# Patient Record
Sex: Female | Born: 1960 | Race: White | Hispanic: No | Marital: Single | Smoking: Current every day smoker
Health system: Southern US, Community
[De-identification: ages and names within clinical notes are randomized; demographics above are authoritative.]

## PROBLEM LIST (undated history)

## (undated) DIAGNOSIS — G629 Polyneuropathy, unspecified: Secondary | ICD-10-CM

## (undated) DIAGNOSIS — E785 Hyperlipidemia, unspecified: Secondary | ICD-10-CM

## (undated) DIAGNOSIS — I7300 Raynaud's syndrome without gangrene: Secondary | ICD-10-CM

## (undated) DIAGNOSIS — E079 Disorder of thyroid, unspecified: Secondary | ICD-10-CM

## (undated) DIAGNOSIS — M542 Cervicalgia: Secondary | ICD-10-CM

## (undated) DIAGNOSIS — M797 Fibromyalgia: Secondary | ICD-10-CM

## (undated) DIAGNOSIS — F329 Major depressive disorder, single episode, unspecified: Secondary | ICD-10-CM

## (undated) DIAGNOSIS — F909 Attention-deficit hyperactivity disorder, unspecified type: Secondary | ICD-10-CM

## (undated) DIAGNOSIS — F32A Depression, unspecified: Secondary | ICD-10-CM

## (undated) DIAGNOSIS — G8929 Other chronic pain: Secondary | ICD-10-CM

## (undated) HISTORY — DX: Attention-deficit hyperactivity disorder, unspecified type: F90.9

## (undated) HISTORY — DX: Cervicalgia: M54.2

## (undated) HISTORY — DX: Raynaud's syndrome without gangrene: I73.00

## (undated) HISTORY — DX: Depression, unspecified: F32.A

## (undated) HISTORY — PX: LEG SURGERY: SHX1003

## (undated) HISTORY — DX: Other chronic pain: G89.29

## (undated) HISTORY — PX: OTHER SURGICAL HISTORY: SHX169

## (undated) HISTORY — DX: Major depressive disorder, single episode, unspecified: F32.9

## (undated) HISTORY — PX: TUBAL LIGATION: SHX77

## (undated) HISTORY — DX: Hyperlipidemia, unspecified: E78.5

---

## 1987-05-06 DIAGNOSIS — E039 Hypothyroidism, unspecified: Secondary | ICD-10-CM

## 1987-05-06 HISTORY — DX: Hypothyroidism, unspecified: E03.9

## 2005-12-23 ENCOUNTER — Ambulatory Visit: Payer: Self-pay | Admitting: Gastroenterology

## 2006-02-06 ENCOUNTER — Ambulatory Visit: Payer: Self-pay | Admitting: Gastroenterology

## 2006-05-05 DIAGNOSIS — K759 Inflammatory liver disease, unspecified: Secondary | ICD-10-CM

## 2006-05-05 HISTORY — DX: Inflammatory liver disease, unspecified: K75.9

## 2006-05-07 ENCOUNTER — Ambulatory Visit: Payer: Self-pay | Admitting: Gastroenterology

## 2006-05-14 ENCOUNTER — Ambulatory Visit: Payer: Self-pay | Admitting: Gastroenterology

## 2006-05-28 ENCOUNTER — Ambulatory Visit: Payer: Self-pay | Admitting: Gastroenterology

## 2006-06-05 ENCOUNTER — Ambulatory Visit (HOSPITAL_COMMUNITY): Admission: RE | Admit: 2006-06-05 | Discharge: 2006-06-05 | Payer: Self-pay | Admitting: Gastroenterology

## 2006-06-11 ENCOUNTER — Ambulatory Visit: Payer: Self-pay | Admitting: Gastroenterology

## 2006-06-25 ENCOUNTER — Ambulatory Visit: Payer: Self-pay | Admitting: Gastroenterology

## 2006-07-23 ENCOUNTER — Encounter: Admission: RE | Admit: 2006-07-23 | Discharge: 2006-07-23 | Payer: Self-pay | Admitting: Obstetrics and Gynecology

## 2006-07-23 ENCOUNTER — Ambulatory Visit: Payer: Self-pay | Admitting: Gastroenterology

## 2006-08-20 ENCOUNTER — Ambulatory Visit: Payer: Self-pay | Admitting: Gastroenterology

## 2006-09-17 ENCOUNTER — Ambulatory Visit: Payer: Self-pay | Admitting: Gastroenterology

## 2006-09-17 ENCOUNTER — Ambulatory Visit (HOSPITAL_COMMUNITY): Admission: RE | Admit: 2006-09-17 | Discharge: 2006-09-17 | Payer: Self-pay | Admitting: Gastroenterology

## 2006-10-15 ENCOUNTER — Ambulatory Visit: Payer: Self-pay | Admitting: Gastroenterology

## 2006-11-19 ENCOUNTER — Ambulatory Visit: Payer: Self-pay | Admitting: Gastroenterology

## 2007-01-21 ENCOUNTER — Ambulatory Visit: Payer: Self-pay | Admitting: Gastroenterology

## 2007-04-22 ENCOUNTER — Ambulatory Visit: Payer: Self-pay | Admitting: Gastroenterology

## 2010-05-05 DIAGNOSIS — F419 Anxiety disorder, unspecified: Secondary | ICD-10-CM

## 2010-05-05 DIAGNOSIS — J189 Pneumonia, unspecified organism: Secondary | ICD-10-CM

## 2010-05-05 HISTORY — DX: Anxiety disorder, unspecified: F41.9

## 2010-05-05 HISTORY — DX: Pneumonia, unspecified organism: J18.9

## 2012-11-14 ENCOUNTER — Emergency Department (HOSPITAL_BASED_OUTPATIENT_CLINIC_OR_DEPARTMENT_OTHER): Payer: BC Managed Care – PPO

## 2012-11-14 ENCOUNTER — Encounter (HOSPITAL_BASED_OUTPATIENT_CLINIC_OR_DEPARTMENT_OTHER): Payer: Self-pay | Admitting: *Deleted

## 2012-11-14 ENCOUNTER — Emergency Department (HOSPITAL_BASED_OUTPATIENT_CLINIC_OR_DEPARTMENT_OTHER)
Admission: EM | Admit: 2012-11-14 | Discharge: 2012-11-14 | Disposition: A | Discharge status: Home / Self Care | Payer: BC Managed Care – PPO | Attending: Emergency Medicine | Admitting: Emergency Medicine

## 2012-11-14 DIAGNOSIS — Z79899 Other long term (current) drug therapy: Secondary | ICD-10-CM | POA: Insufficient documentation

## 2012-11-14 DIAGNOSIS — L539 Erythematous condition, unspecified: Secondary | ICD-10-CM | POA: Insufficient documentation

## 2012-11-14 DIAGNOSIS — E079 Disorder of thyroid, unspecified: Secondary | ICD-10-CM | POA: Insufficient documentation

## 2012-11-14 DIAGNOSIS — F17200 Nicotine dependence, unspecified, uncomplicated: Secondary | ICD-10-CM | POA: Insufficient documentation

## 2012-11-14 DIAGNOSIS — G589 Mononeuropathy, unspecified: Secondary | ICD-10-CM | POA: Insufficient documentation

## 2012-11-14 DIAGNOSIS — M7989 Other specified soft tissue disorders: Secondary | ICD-10-CM | POA: Insufficient documentation

## 2012-11-14 DIAGNOSIS — IMO0001 Reserved for inherently not codable concepts without codable children: Secondary | ICD-10-CM | POA: Insufficient documentation

## 2012-11-14 HISTORY — DX: Disorder of thyroid, unspecified: E07.9

## 2012-11-14 HISTORY — DX: Fibromyalgia: M79.7

## 2012-11-14 HISTORY — DX: Polyneuropathy, unspecified: G62.9

## 2012-11-14 LAB — CBC WITH DIFFERENTIAL/PLATELET
Eosinophils Absolute: 0.1 10*3/uL (ref 0.0–0.7)
Eosinophils Relative: 2 % (ref 0–5)
Lymphocytes Relative: 26 % (ref 12–46)
MCH: 32.5 pg (ref 26.0–34.0)
MCHC: 34.2 g/dL (ref 30.0–36.0)
MCV: 95 fL (ref 78.0–100.0)
Monocytes Absolute: 0.7 10*3/uL (ref 0.1–1.0)
Monocytes Relative: 11 % (ref 3–12)
Neutro Abs: 4.2 10*3/uL (ref 1.7–7.7)
RBC: 4.24 MIL/uL (ref 3.87–5.11)

## 2012-11-14 LAB — BASIC METABOLIC PANEL
BUN: 14 mg/dL (ref 6–23)
Calcium: 9.6 mg/dL (ref 8.4–10.5)
GFR calc Af Amer: 65 mL/min — ABNORMAL LOW (ref >90)
Glucose, Bld: 120 mg/dL — ABNORMAL HIGH (ref 70–99)

## 2012-11-14 MED ORDER — IBUPROFEN 800 MG PO TABS: 800.0000 mg | ORAL_TABLET | Freq: Three times a day (TID) | ORAL | Status: DC

## 2012-11-14 MED ORDER — SULFAMETHOXAZOLE-TRIMETHOPRIM 800-160 MG PO TABS: 1.0000 | ORAL_TABLET | Freq: Two times a day (BID) | ORAL | Status: AC

## 2012-11-14 NOTE — ED Notes (Signed)
MD at bedside. 

## 2012-11-14 NOTE — ED Notes (Signed)
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

## 2012-11-14 NOTE — ED Provider Notes (Signed)
History    CSN: [id number] Arrival date & time 11/14/12  1338  First MD Initiated Contact with Patient 11/14/12 1414     Chief Complaint  Patient presents with  . red warm painful area on left calf    (Consider location/radiation/quality/duration/timing/severity/associated sxs/prior Treatment) Patient is a 52 y.o. female presenting with leg pain. The history is provided by the patient. No language interpreter was used.  Leg Pain Location:  Leg Injury: no   Leg location:  L leg Pain details:    Quality:  Aching   Radiates to:  Does not radiate   Severity:  Moderate   Onset quality:  Gradual   Duration:  3 days   Timing:  Constant   Progression:  Worsening Chronicity:  New Foreign body present:  No foreign bodies Worsened by:  Nothing tried Ineffective treatments:  None tried Pt reports she has redness and pain to her left leg and calf muscle area.   Pt worried about a blood clot Past Medical History  Diagnosis Date  . Neuropathy   . Fibromyalgia   . Thyroid disease    Past Surgical History  Procedure Laterality Date  . Cesarean section     History reviewed. No pertinent family history. History  Substance Use Topics  . Smoking status: Current Every Day Smoker  . Smokeless tobacco: Not on file  . Alcohol Use: Yes     Comment: occ   OB History   Grav Para Term Preterm Abortions TAB SAB Ect Mult Living                 Review of Systems  Musculoskeletal: Positive for myalgias and joint swelling.  All other systems reviewed and are negative.    Allergies  Review of patient's allergies indicates no known allergies.  Home Medications   Current Outpatient Rx  Name  Route  Sig  Dispense  Refill  . amphetamine-dextroamphetamine (ADDERALL) 10 MG tablet   Oral   Take 10 mg by mouth daily.         . DULoxetine (CYMBALTA) 20 MG capsule   Oral   Take 20 mg by mouth daily.         Marland Kitchen levothyroxine (SYNTHROID, LEVOTHROID) 100 MCG tablet   Oral   Take 100  mcg by mouth daily before breakfast.         . methadone (DOLOPHINE) 10 MG tablet   Oral   Take 10 mg by mouth every 8 (eight) hours.          BP 142/77  Pulse 80  Temp(Src) 98.9 F (37.2 C) (Oral)  Resp 16  Ht 5\' 7"  (1.702 m)  Wt 195 lb (88.451 kg)  BMI 30.53 kg/m2  SpO2 99% Physical Exam  Nursing note and vitals reviewed. Constitutional: She appears well-developed and well-nourished.  HENT:  Head: Normocephalic.  Cardiovascular: Normal rate.   Pulmonary/Chest: Effort normal.  Abdominal: Soft.  Musculoskeletal: She exhibits tenderness.  Tender left lower leg,  Slight redness,    Neurological: She is alert.  Skin: Skin is warm.  Psychiatric: She has a normal mood and affect.    ED Course  Procedures (including critical care time) Labs Reviewed  BASIC METABOLIC PANEL - Abnormal; Notable for the following:    Glucose, Bld 120 (*)    GFR calc non Af Amer 56 (*)    GFR calc Af Amer 65 (*)    All other components within normal limits  CBC WITH DIFFERENTIAL   US  Venous Img Lower Unilateral Left  11/14/2012   *RADIOLOGY REPORT*  Clinical Data: Swelling  LEFT LOWER EXTREMITY VENOUS DUPLEX ULTRASOUND  Technique:  Gray-scale sonography with graded compression, as well as color Doppler and duplex ultrasound were performed to evaluate the deep venous system of the lower extremity from the level of the common femoral vein through the popliteal and proximal calf veins. Spectral Doppler was utilized to evaluate flow at rest and with distal augmentation maneuvers.  Comparison:  None.  Findings:  Normal compressibility of the common femoral, superficial femoral, and popliteal veins is demonstrated, as well as the visualized proximal calf veins.  No filling defects to suggest DVT on grayscale or color Doppler imaging.  Doppler waveforms show normal direction of venous flow, normal respiratory phasicity and response to augmentation.  IMPRESSION: No evidence of lower extremity deep vein  thrombosis.   Original Report Authenticated By: Janeece Riggers, M.D.   No diagnosis found.  MDM  Labs normal.  No dvt on ultrasound.   RX for ibuprofen and bactrim.   Pt advised to see her Md for recheck tomorrow,  Possible early cellulitis.   Pt reports she may have been bitten by something.    Lonia Skinner [place], PA-C 11/14/12 1556

## 2012-11-14 NOTE — ED Provider Notes (Signed)
Medical screening examination/treatment/procedure(s) were performed by non-physician practitioner and as supervising physician I was immediately available for consultation/collaboration.   Rolan Bucco, MD 11/14/12 [phone]

## 2015-07-16 ENCOUNTER — Emergency Department (HOSPITAL_BASED_OUTPATIENT_CLINIC_OR_DEPARTMENT_OTHER): Payer: BLUE CROSS/BLUE SHIELD

## 2015-07-16 ENCOUNTER — Encounter (HOSPITAL_BASED_OUTPATIENT_CLINIC_OR_DEPARTMENT_OTHER): Payer: Self-pay | Admitting: *Deleted

## 2015-07-16 ENCOUNTER — Emergency Department (HOSPITAL_BASED_OUTPATIENT_CLINIC_OR_DEPARTMENT_OTHER)
Admission: EM | Admit: 2015-07-16 | Discharge: 2015-07-16 | Disposition: A | Discharge status: Home / Self Care | Payer: BLUE CROSS/BLUE SHIELD | Attending: Emergency Medicine | Admitting: Emergency Medicine

## 2015-07-16 DIAGNOSIS — Z791 Long term (current) use of non-steroidal anti-inflammatories (NSAID): Secondary | ICD-10-CM | POA: Diagnosis not present

## 2015-07-16 DIAGNOSIS — M797 Fibromyalgia: Secondary | ICD-10-CM | POA: Diagnosis not present

## 2015-07-16 DIAGNOSIS — Z79899 Other long term (current) drug therapy: Secondary | ICD-10-CM | POA: Insufficient documentation

## 2015-07-16 DIAGNOSIS — E079 Disorder of thyroid, unspecified: Secondary | ICD-10-CM | POA: Diagnosis not present

## 2015-07-16 DIAGNOSIS — F17200 Nicotine dependence, unspecified, uncomplicated: Secondary | ICD-10-CM | POA: Diagnosis not present

## 2015-07-16 DIAGNOSIS — G459 Transient cerebral ischemic attack, unspecified: Secondary | ICD-10-CM | POA: Insufficient documentation

## 2015-07-16 DIAGNOSIS — G629 Polyneuropathy, unspecified: Secondary | ICD-10-CM | POA: Diagnosis not present

## 2015-07-16 DIAGNOSIS — H538 Other visual disturbances: Secondary | ICD-10-CM | POA: Diagnosis present

## 2015-07-16 LAB — DIFFERENTIAL
BASOS ABS: 0 10*3/uL (ref 0.0–0.1)
BASOS PCT: 0 %
EOS ABS: 0.1 10*3/uL (ref 0.0–0.7)
Eosinophils Relative: 2 %
Lymphocytes Relative: 26 %
Lymphs Abs: 1.7 10*3/uL (ref 0.7–4.0)
MONOS PCT: 10 %
Monocytes Absolute: 0.6 10*3/uL (ref 0.1–1.0)
Neutro Abs: 4.2 10*3/uL (ref 1.7–7.7)
Neutrophils Relative %: 62 %

## 2015-07-16 LAB — COMPREHENSIVE METABOLIC PANEL
ALT: 11 U/L — ABNORMAL LOW (ref 14–54)
AST: 20 U/L (ref 15–41)
Albumin: 4.1 g/dL (ref 3.5–5.0)
Alkaline Phosphatase: 48 U/L (ref 38–126)
Anion gap: 6 (ref 5–15)
BUN: 11 mg/dL (ref 6–20)
CHLORIDE: 102 mmol/L (ref 101–111)
CO2: 32 mmol/L (ref 22–32)
Calcium: 8.9 mg/dL (ref 8.9–10.3)
Creatinine, Ser: 0.91 mg/dL (ref 0.44–1.00)
Glucose, Bld: 87 mg/dL (ref 65–99)
POTASSIUM: 4 mmol/L (ref 3.5–5.1)
Sodium: 140 mmol/L (ref 135–145)
Total Bilirubin: 0.8 mg/dL (ref 0.3–1.2)
Total Protein: 7.3 g/dL (ref 6.5–8.1)

## 2015-07-16 LAB — APTT: APTT: 28 s (ref 24–37)

## 2015-07-16 LAB — URINALYSIS, ROUTINE W REFLEX MICROSCOPIC
Bilirubin Urine: NEGATIVE
Glucose, UA: NEGATIVE mg/dL
Hgb urine dipstick: NEGATIVE
KETONES UR: NEGATIVE mg/dL
LEUKOCYTES UA: NEGATIVE
NITRITE: NEGATIVE
PH: 7 (ref 5.0–8.0)
PROTEIN: NEGATIVE mg/dL
Specific Gravity, Urine: 1.011 (ref 1.005–1.030)

## 2015-07-16 LAB — CBC
HEMATOCRIT: 41.5 % (ref 36.0–46.0)
Hemoglobin: 13.7 g/dL (ref 12.0–15.0)
MCH: 32.3 pg (ref 26.0–34.0)
MCHC: 33 g/dL (ref 30.0–36.0)
MCV: 97.9 fL (ref 78.0–100.0)
Platelets: 205 10*3/uL (ref 150–400)
RBC: 4.24 MIL/uL (ref 3.87–5.11)
RDW: 13.2 % (ref 11.5–15.5)
WBC: 6.7 10*3/uL (ref 4.0–10.5)

## 2015-07-16 LAB — PROTIME-INR
INR: 0.88 (ref 0.00–1.49)
Prothrombin Time: 12.2 seconds (ref 11.6–15.2)

## 2015-07-16 LAB — ETHANOL: Alcohol, Ethyl (B): <5 mg/dL (ref <5)

## 2015-07-16 NOTE — ED Notes (Signed)
Pt noted to have bradycardia 40's. Denies hx of same or other s/s. Alert. Neuro WNL.

## 2015-07-16 NOTE — ED Notes (Signed)
Pt denies symptoms at this time.  Reports slurred speech, forgetfulness, dizziness, migraine 2 days ago-symptoms lasted about 24 hours.  Pt called her PCP and was told that she had a TIA and needed further symptoms.  Pt ambulatory, speech clear-pt reports that she is at her normal at this time.  CVA screen negative.

## 2015-07-16 NOTE — ED Provider Notes (Signed)
CSN: [id number]     Arrival date & time 07/16/15  1528 History  By signing my name below, I, Tanda RockersMargaux Venter, attest that this documentation has been prepared under the direction and in the presence of Rolan BuccoMelanie Dorothe Elmore, MD. Electronically Signed: Tanda RockersMargaux Venter, ED Scribe. 07/16/2015. 5:26 PM.   Chief Complaint  Patient presents with  . Transient Ischemic Attack   The history is provided by the patient. No language interpreter was used.     HPI Comments: Haley Valdez is a 55 y.o. female with PMHx HTN who presents to the Emergency Department complaining of stroke like symptoms that occurred 2 days ago, since resolved. Pt reports that she began having bilateral blurry vision, inability to focus, hearing loss in both ears, a diffuse headache, unsteady gait, aphasia, and left arm numbness that lasted all day 2 days ago.  She was also dropping things with the left arm.  No leg involvement.  She reports that she did not notice the symptoms yesterday because she slept all day. She mentions that she is still having some difficulty with concentrating and mild left arm numbness but states it has been improving since onset. Pt has never had symptoms like this in the past. She called her PCP, Dr. Luiz Ironabeza, today and spoke to the nurse. She was told that she had a TIA and that she should come to the ED for further evaluation. Pt also reports patches of redness to her left lower leg that she noticed yesterday. Denies weakness or any other associated symptoms.   Past Medical History  Diagnosis Date  . Neuropathy (HCC)   . Fibromyalgia   . Thyroid disease    Past Surgical History  Procedure Laterality Date  . Cesarean section     History reviewed. No pertinent family history. Social History  Substance Use Topics  . Smoking status: Current Every Day Smoker  . Smokeless tobacco: None  . Alcohol Use: Yes     Comment: occ   OB History    No data available     Review of Systems  Constitutional: Negative for  fever, chills, diaphoresis and fatigue.  HENT: Positive for hearing loss. Negative for congestion, rhinorrhea and sneezing.   Eyes: Positive for visual disturbance.  Respiratory: Negative for cough, chest tightness and shortness of breath.   Cardiovascular: Negative for chest pain and leg swelling.  Gastrointestinal: Negative for nausea, vomiting, abdominal pain, diarrhea and blood in stool.  Genitourinary: Negative for frequency, hematuria, flank pain and difficulty urinating.  Musculoskeletal: Positive for gait problem. Negative for back pain and arthralgias.  Skin: Negative for rash.  Neurological: Positive for speech difficulty, numbness and headaches. Negative for dizziness and weakness.  Psychiatric/Behavioral: Positive for decreased concentration.   Allergies  Review of patient's allergies indicates no known allergies.  Home Medications   Prior to Admission medications   Medication Sig Start Date End Date Taking? Authorizing Provider  gabapentin (NEURONTIN) 300 MG capsule Take 300 mg by mouth 3 (three) times daily.   Yes Historical Provider, MD  LORazepam (ATIVAN) 1 MG tablet Take 1 mg by mouth every 8 (eight) hours.   Yes Historical Provider, MD  amphetamine-dextroamphetamine (ADDERALL) 10 MG tablet Take 10 mg by mouth daily.    Historical Provider, MD  ibuprofen (ADVIL,MOTRIN) 800 MG tablet Take 1 tablet (800 mg total) by mouth 3 (three) times daily. 11/14/12   Elson AreasLeslie K Sofia, PA-C  levothyroxine (SYNTHROID, LEVOTHROID) 100 MCG tablet Take 100 mcg by mouth daily before breakfast.  Historical Provider, MD  methadone (DOLOPHINE) 10 MG tablet Take 10 mg by mouth every 8 (eight) hours.    Historical Provider, MD   BP 106/57 mmHg  Pulse 48  Temp(Src) 98.2 F (36.8 C) (Oral)  Resp 14  Ht  (1.702 m)  Wt 190 lb (86.183 kg)  BMI 29.75 kg/m2  SpO2 97%   Physical Exam  Constitutional: She is oriented to person, place, and time. She appears well-developed and well-nourished.   HENT:  Head: Normocephalic and atraumatic.  Eyes: Pupils are equal, round, and reactive to light.  Neck: Normal range of motion. Neck supple.  Cardiovascular: Normal rate, regular rhythm and normal heart sounds.   Pulmonary/Chest: Effort normal and breath sounds normal. No respiratory distress. She has no wheezes. She has no rales. She exhibits no tenderness.  Abdominal: Soft. Bowel sounds are normal. There is no tenderness. There is no rebound and no guarding.  Musculoskeletal: Normal range of motion. She exhibits no edema.  Lymphadenopathy:    She has no cervical adenopathy.  Neurological: She is alert and oriented to person, place, and time.  Motor 5 out of 5 all extremities, sensation grossly intact to light touch all extremities, finger-to-nose intact, no pronator drift, gait normal, cranial nerves II through XII grossly intact  Skin: Skin is warm and dry. No rash noted.  Psychiatric: She has a normal mood and affect.    ED Course  Procedures (including critical care time)  DIAGNOSTIC STUDIES: Oxygen Saturation is 99% on RA, normal by my interpretation.    COORDINATION OF CARE: 5:22 PM-Discussed treatment plan with pt at bedside and pt agreed to plan.   Labs Review Labs Reviewed  COMPREHENSIVE METABOLIC PANEL - Abnormal; Notable for the following:    ALT 11 (*)    All other components within normal limits  ETHANOL  PROTIME-INR  APTT  CBC  DIFFERENTIAL  URINALYSIS, ROUTINE W REFLEX MICROSCOPIC (NOT AT [facility])    Imaging Review Ct Head Wo Contrast  07/16/2015  CLINICAL DATA:  Transient ischemic attack on Saturday. EXAM: CT HEAD WITHOUT CONTRAST TECHNIQUE: Contiguous axial images were obtained from the base of the skull through the vertex without intravenous contrast. COMPARISON:  None. FINDINGS: The ventricles are normal in size and configuration. No extra-axial fluid collections are identified. The gray-white differentiation is normal. No CT findings for acute intracranial  process such as hemorrhage or infarction. No mass lesions. The brainstem and cerebellum are grossly normal. The bony structures are intact. The paranasal sinuses and mastoid air cells are clear. The globes are intact. IMPRESSION: Normal head CT. Electronically Signed   By: Rudie Meyer M.D.   On: 07/16/2015 17:49   I have personally reviewed and evaluated these images and lab results as part of my medical decision-making.   EKG Interpretation   Date/Time:  Monday July 16 2015 18:03:13 EDT Ventricular Rate:  49 PR Interval:  167 QRS Duration: 101 QT Interval:  446 QTC Calculation: 403 R Axis:   68 Text Interpretation:  Sinus bradycardia LAE, consider biatrial enlargement  No old tracing to compare Confirmed by Axzel Rockhill  MD, Schuyler Olden ([id number]) on  07/16/2015 6:19:58 PM      MDM   Final diagnoses:  Transient cerebral ischemia, unspecified transient cerebral ischemia type   Patient presents with symptoms of a headache associated with vision deficits, hearing deficits, word finding problems and left arm numbness and clumsiness that happened 2 days ago. She still has some mild symptoms of numbness in the left arm  but no other symptoms. Her head CT is negative. She has no neurologic deficits on exam currently.  I discussed with the neurohospitalist, Dr. Cherylynn Ridges, who Feels that it's a appropriate for patient to have a further evaluation as an outpatient for TIA evaluation. This is given that her symptoms happened 2 days ago. I did an urgent referral to neurology.  I also advised patient to contact the neurologist if she doesn't hear from them in the next 24 hours. Patient was noted to be bradycardic in the 40s and 50s. We did ambulate her and she is not symptomatic. She is in a sinus bradycardia. An angulation or heart rate is in the 50s and 60s. She has no dizziness or other associated symptoms. She's not on beta blockers. She does have hypothyroidism. I advised her that she needs to have follow-up  this week with her primary care physician, Dr. Luiz Iron. Return precautions were given.  I personally performed the services described in this documentation, which was scribed in my presence.  The recorded information has been reviewed and considered.      Rolan Bucco, MD 07/16/15 2121

## 2015-07-16 NOTE — Discharge Instructions (Signed)
Transient Ischemic Attack °A transient ischemic attack (TIA) is a "warning stroke" that causes stroke-like symptoms. A TIA does not cause lasting damage to the brain. The symptoms of a TIA can happen fast and do not last long. It is important to know the symptoms of a TIA and what to do. This can help prevent stroke or death.  °HOME CARE  °· Take medicines only as told by your doctor. Make sure you understand all of the instructions. °· You may need to take aspirin or warfarin medicine. Warfarin needs to be taken exactly as told. °¨ Taking too much or too little warfarin is dangerous. Blood tests must be done as often as told by your doctor. A PT blood test measures how long it takes for blood to clot. Your PT is used to calculate another value called an INR. Your PT and INR help your doctor adjust your warfarin dosage. He or she will make sure you are taking the right amount. °¨ Food can cause problems with warfarin and affect the results of your blood tests. This is true for foods high in vitamin K. Eat the same amount of foods high in vitamin K each day. Foods high in vitamin K include spinach, kale, broccoli, cabbage, collard and turnip greens, Brussels sprouts, peas, cauliflower, seaweed, and parsley. Other foods high in vitamin K include beef and pork liver, green tea, and soybean oil. Eat the same amount of foods high in vitamin K each day. Avoid big changes in your diet. Tell your doctor before changing your diet. Talk to a food specialist (dietitian) if you have questions. °¨ Many medicines can cause problems with warfarin and affect your PT and INR. Tell your doctor about all medicines you take. This includes vitamins and dietary pills (supplements). Do not take or stop taking any prescribed or over-the-counter medicines unless your doctor tells you to. °¨ Warfarin can cause more bruising or bleeding. Hold pressure over any cuts for longer than normal. Talk to your doctor about other side effects of  warfarin. °¨ Avoid sports or activities that may cause injury or bleeding. °¨ Be careful when you shave, floss, or use sharp objects. °¨ Avoid or drink very little alcohol while taking warfarin. Tell your doctor if you change how much alcohol you drink. °¨ Tell your dentist and other doctors that you take warfarin before any procedures. °· Follow your diet program as told, if you are given one. °· Keep a healthy weight. °· Stay active. Try to get at least 30 minutes of activity on all or most days. °· Do not use any tobacco products, including cigarettes, chewing tobacco, or electronic cigarettes. If you need help quitting, ask your doctor. °· Limit alcohol intake to no more than 1 drink per day for nonpregnant women and 2 drinks per day for men. One drink equals 12 ounces of beer, 5 ounces of wine, or 1½ ounces of hard liquor. °· Do not abuse drugs. °· Keep your home safe so you do not fall. You can do this by: °¨ Putting grab bars in the bedroom and bathroom. °¨ Raising toilet seats. °¨ Putting a seat in the shower. °· Keep all follow-up visits as told by your doctor. This is important. °GET HELP IF: °· Your personality changes. °· You have trouble swallowing. °· You have double vision. °· You are dizzy. °· You have a fever. °GET HELP RIGHT AWAY IF:  °These symptoms may be an emergency. Do not wait to see if the   symptoms will go away. Get medical help right away. Call your local emergency services (911 in the [place] Do not drive yourself to the hospital. °· You have sudden weakness or lose feeling (go numb), especially on one side of the body. This can affect your: °¨ Face. °¨ Arm. °¨ Leg. °· You have sudden trouble walking. °· You have sudden trouble moving your arms or legs. °· You have sudden confusion. °· You have trouble talking. °· You have trouble understanding. °· You have sudden trouble seeing in one or both eyes. °· You lose your balance. °· Your movements are not smooth. °· You have a sudden, very bad  headache with no known cause. °· You have new chest pain. °· Your heartbeat is unsteady. °· You are partly or totally unaware of what is going on around you. °MAKE SURE YOU:  °· Understand these instructions. °· Will watch your condition. °· Will get help right away if you are not doing well or get worse. °  °This information is not intended to replace advice given to you by your health care provider. Make sure you discuss any questions you have with your health care provider. °  °Document Released: 01/29/2008 Document Revised: 05/12/2014 Document Reviewed: 07/27/2013 °Elsevier Interactive Patient Education ©2016 [employer] ° °

## 2015-07-16 NOTE — ED Notes (Signed)
Pt states that on Saturday, she had an episode  (around midday) of blurred vision. Lost focus and had trouble understanding words. H/A followed these s/s. Left arm was numb and she was confused. This lasted about 5-6 minutes. Later on, she still had some increased confusion and trouble with balance. Trouble forming words. Got better. Sunday, slept most of the day. Today, just feels fatigued. Neuro WNL.

## 2016-04-23 ENCOUNTER — Other Ambulatory Visit (HOSPITAL_COMMUNITY): Payer: Self-pay | Admitting: Nurse Practitioner

## 2017-04-06 ENCOUNTER — Other Ambulatory Visit: Payer: Self-pay | Admitting: Neurosurgery

## 2017-04-06 DIAGNOSIS — M4722 Other spondylosis with radiculopathy, cervical region: Secondary | ICD-10-CM

## 2017-04-20 ENCOUNTER — Ambulatory Visit
Admission: RE | Admit: 2017-04-20 | Discharge: 2017-04-20 | Disposition: A | Discharge status: Home / Self Care | Payer: BLUE CROSS/BLUE SHIELD | Source: Ambulatory Visit | Attending: Neurosurgery | Admitting: Neurosurgery

## 2017-04-20 DIAGNOSIS — M4722 Other spondylosis with radiculopathy, cervical region: Secondary | ICD-10-CM

## 2017-04-20 IMAGING — XA DG FACET JT INJ L OR S SPINE SINGLE LEVEL *L*
2 series · 2 of 2 positions shown · non-contrast
Comparison: MRI [DATE]

CLINICAL DATA: Left C3-4 facet arthropathy with left neck pain

EXAM:
left C3-4 facet injection

[Series 1: ortho standard · 1 of 1 slices shown (1 of 2)]
[im 1/1]
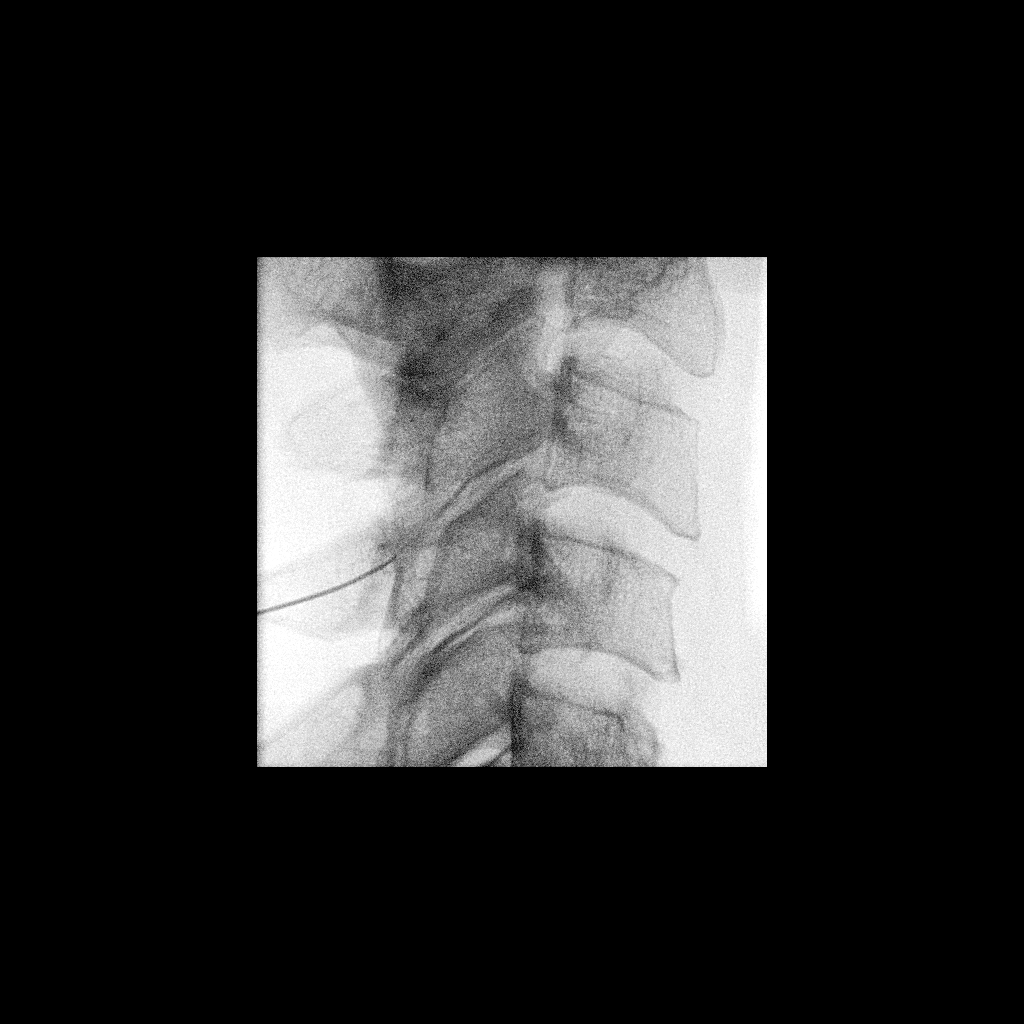

[Series 2: ortho standard · 1 of 1 slices shown (2 of 2)]
[im 1/1]
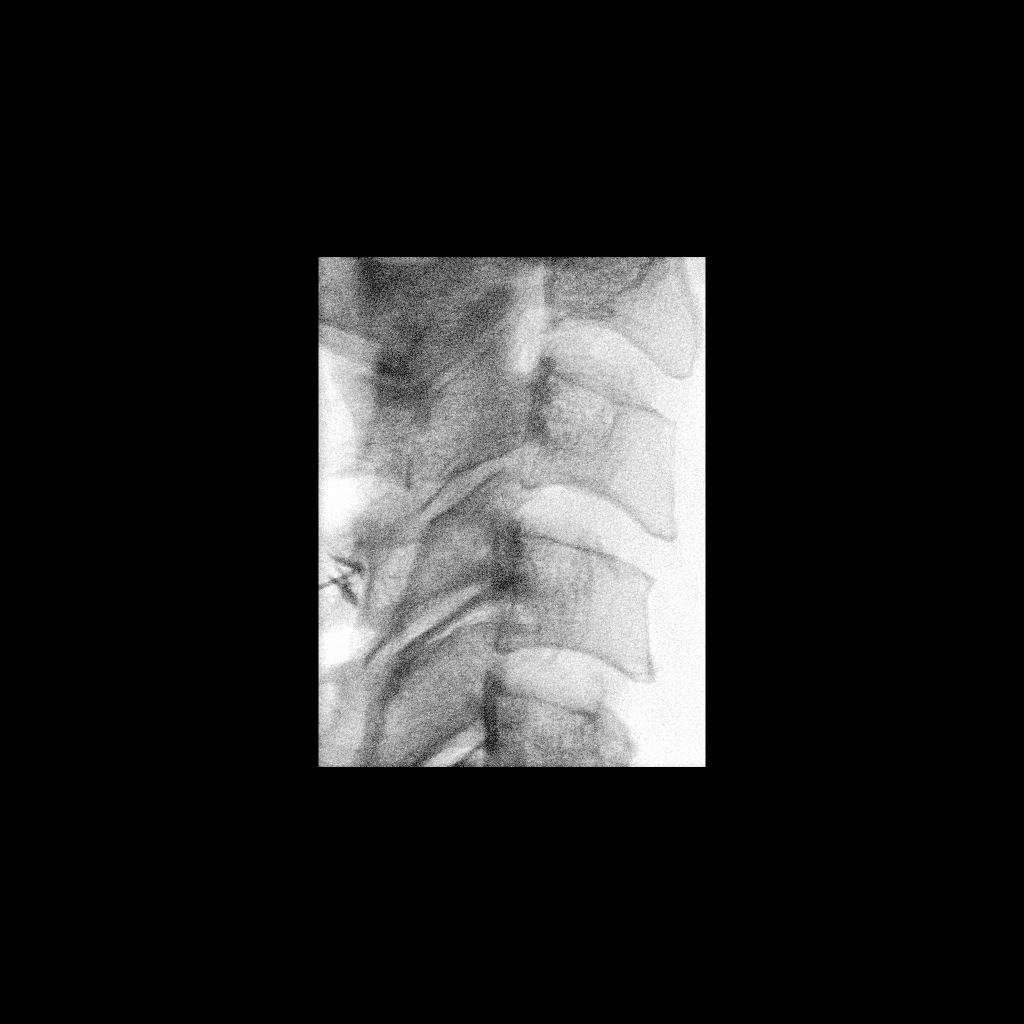

[2 of 2 positions shown; findings below may reference images not displayed]

PROCEDURE:
The procedure, risks, benefits, and alternatives were explained to
the patient. Questions regarding the procedure were encouraged and
answered. The patient understands and consents to the procedure.

Posterior approach was taken to the left C3-4 facet using a curved
25 gauge spinal needle. A few drops of [GU] were injected to
show intraarticular positioning and absence of vascular flow. 0.7 cc
dexamethasone was injected. The procedure was well-tolerated. No
evident complication.

FLUOROSCOPY TIME:  1 minutes 30 seconds. 32.08 micro gray meter
squared
IMPRESSION: Technically successful [GU]-[GU] injection .

## 2017-04-20 MED ORDER — DEXAMETHASONE SODIUM PHOSPHATE 4 MG/ML IJ SOLN: 6.0000 mg | Freq: Once | INTRAMUSCULAR | Status: DC

## 2017-04-20 MED ORDER — IOPAMIDOL (ISOVUE-M 300) INJECTION 61%: 1.0000 mL | Freq: Once | INTRAMUSCULAR | Status: DC | PRN

## 2017-04-20 NOTE — Discharge Instructions (Signed)

## 2017-04-22 ENCOUNTER — Encounter: Payer: Self-pay | Admitting: Neurology

## 2017-04-22 ENCOUNTER — Ambulatory Visit (INDEPENDENT_AMBULATORY_CARE_PROVIDER_SITE_OTHER): Payer: BLUE CROSS/BLUE SHIELD | Admitting: Neurology

## 2017-04-22 VITALS — BP 118/77 | HR 76 | Ht 67.0 in | Wt 174.0 lb

## 2017-04-22 DIAGNOSIS — R202 Paresthesia of skin: Secondary | ICD-10-CM | POA: Diagnosis not present

## 2017-04-22 DIAGNOSIS — M542 Cervicalgia: Secondary | ICD-10-CM | POA: Insufficient documentation

## 2017-04-22 NOTE — Progress Notes (Signed)
PATIENT: Haley Valdez DOB: 1960/06/09  Chief Complaint  Patient presents with  . Transient Ischemic Attack    She is here with her grandson, Gerilyn PilgrimJacob.  Reports several episodes of slurred speech and headaches.  Her PCP ordered a brain MRI that was abnormal.  She would like to further discuss these results.  Marland Kitchen. PCP    Andreas Blowerabeza, Yuri M., MD     HISTORICAL  Haley Valdez is a 56 year old female, seen in refer by her primary care doctor Andreas BlowerCabeza, Yuri M.  for evaluation of transient ischemic attack, initial evaluation was on April 22, 2017.  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, around 1995, she began to experience neck pain, intermittent bilateral feet and hand paresthesia, began to seek neurological care, per patient, patient was diagnosed was carpal tunnel syndromes, peripheral neuropathy,  Then she began to develop unsteady gait,  began to fall since age 56,  She was also diagnosed with hepatitis C received treatment around 2008, around that time, she complains of excessive fatigue, generalized weakness,  In 2015, with her abnormal neck posturing, persistent neck pain, she received Botox injection in May 2015, responded very well, second injection in August 2015, has caused left neck pain, swollen, weakness, has to hold her head up with her hand  Since August 2015, she complains of frequent left-sided neck pain, radiating pain to left occipital, left parietal region, left side headaches,  She had extensive evaluations, I was able to review outside MRI cervical report October 2015, Left C3-4 facet abnormality is deep to the tender palpable  abnormality. The facet is overgrown due to degenerative change. In addition, there is bone marrow and adjacent soft tissue edema and enhancement suggesting active osteoarthritis. Underlying infection not excluded  but considered less likely. Correlate with symptoms. Nevidence of discitis or abscess. There is cervical spondylosis. No other acute abnormality.  No mass or adenopathy detected.  MRI brian in Oct 2018: Mild nonspecific supratentorial small vessel disease, no contrast enhancement,  Repeat MRI in [place] in June 2018.  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 Nitrofurantoin Itching    Redness in feet   . Wasp Venom Protein Other (See Comments)    cellutlitis    HOME MEDICATIONS: Current Outpatient Medications  Medication Sig Dispense Refill  . amphetamine-dextroamphetamine (ADDERALL) 10 MG tablet Take 10 mg by mouth daily.    Marland Kitchen. gabapentin (NEURONTIN) 300 MG capsule Take 300 mg by mouth 3 (three) times daily.    Marland Kitchen. LORazepam (ATIVAN) 1 MG tablet Take 1 mg by mouth every 8 (eight) hours.    . methadone (DOLOPHINE) 10 MG tablet Take 10 mg by mouth every 8 (eight) hours.    . Thyroid (LEVOTHYROXINE-LIOTHYRONINE) 120 MG TABS Take 1 tablet by mouth daily.  1   No current facility-administered medications for this visit.     PAST MEDICAL HISTORY: Past Medical History:  Diagnosis Date  . ADHD   . Chronic neck pain   .  Depression   . Fibromyalgia   . Hyperlipemia   . Neuropathy   . Raynaud's disease   . Thyroid disease     PAST SURGICAL HISTORY: Past Surgical History:  Procedure Laterality Date  . arm surgery Right    Fracture  . CESAREAN SECTION    . LEG SURGERY Right    Fracture    . TUBAL LIGATION      FAMILY HISTORY: Family History  Problem Relation Age of Onset  . Heart attack Mother   . Stroke Father     SOCIAL HISTORY:  Social History   Socioeconomic History  . Marital status: Married    Spouse name: Not on file  . Number of children: 2  . Years of education: 56  . Highest education level: Associate degree: occupational, Scientist, product/process development, or vocational program  Social Needs  . Financial resource strain: Not on file  . Food insecurity - worry: Not on file  . Food insecurity - inability: Not on file  . Transportation needs - medical: Not on file  . Transportation needs - non-medical: Not on file  Occupational History  . Occupation: Chief Executive Officer  Tobacco Use  . Smoking status: Current Every Day Smoker    Packs/day: 1.00    Types: Cigarettes  . Smokeless tobacco: Never Used  Substance and Sexual Activity  . Alcohol use: Yes    Comment: occ  . Drug use: No  . Sexual activity: Not on file  Other Topics Concern  . Not on file  Social History Narrative   Lives at home with her boyfriend.   Right-handed.   1 cup caffeine per day.     PHYSICAL EXAM   Vitals:   04/22/17 0745  BP: 118/77  Pulse: 76  Weight: 174 lb (78.9 kg)  Height: 5\' 7"  (1.702 m)    Not recorded      Body mass index is 27.25 kg/m.  PHYSICAL EXAMNIATION:  Gen: NAD, conversant, well nourised, obese, well groomed                     Cardiovascular: Regular rate rhythm, no peripheral edema, warm, nontender. Eyes: Conjunctivae clear without exudates or hemorrhage Neck: Supple, no carotid bruits. Pulmonary: Clear to auscultation bilaterally   NEUROLOGICAL EXAM:  MENTAL STATUS: Speech:    Speech is normal; fluent and spontaneous with normal comprehension.  Cognition:     Orientation to time, place and person     Normal recent and remote memory     Normal Attention span and concentration     Normal Language, naming, repeating,spontaneous speech     Fund of  knowledge   CRANIAL NERVES: CN II: Visual fields are full to confrontation. Fundoscopic exam is normal with sharp discs and no vascular changes. Pupils are round equal and briskly reactive to light. CN III, IV, VI: extraocular movement are normal. No ptosis. CN V: Facial sensation is intact to pinprick in all 3 divisions bilaterally. Corneal responses are intact.  CN VII: Face is symmetric with normal eye closure and smile. CN VIII: Hearing is normal to rubbing fingers CN IX, X: Palate elevates symmetrically. Phonation is normal. CN XI: Head turning and shoulder shrug are intact CN XII: Tongue is midline with normal movements and no atrophy.  MOTOR: There is no pronator drift of out-stretched arms. Muscle bulk and tone are normal. Muscle strength is normal.  REFLEXES: Reflexes are 2+ and symmetric at the biceps, triceps, knees, and ankles. Plantar responses are  flexor.  SENSORY: Intact to light touch, pinprick, positional sensation and vibratory sensation are intact in fingers and toes.  COORDINATION: Rapid alternating movements and fine finger movements are intact. There is no dysmetria on finger-to-nose and heel-knee-shin.    GAIT/STANCE: Exaggerated tandem walking, but she was able to walk tiptoe, heel walking,   DIAGNOSTIC DATA (LABS, IMAGING, TESTING) - I reviewed patient records, labs, notes, testing and imaging myself where available.   ASSESSMENT AND PLAN  Haley Valdez is a 56 y.o. female   Constellation of complaints, including bilateral upper lower extremity paresthesia, balance issues, neck pain, abnormal MRI of the brain,  I have advised her brain MRI of the brain and cervical spine to review at next visit  Continue pain management as planned  Laboratory evaluation to rule out infectious, nutritional deficiency, inflammatory process.   Levert FeinsteinYijun Cherese Lozano, M.D. Ph.D.  [facility] [address], [address] [place], [place] [address] Ph: [phone]) [phone] Fax:  [phone]  CC: Andreas Blowerabeza, Yuri M., MD

## 2017-04-27 LAB — C-REACTIVE PROTEIN: CRP: 2.5 mg/L (ref 0.0–4.9)

## 2017-04-27 LAB — ANA W/REFLEX: ANA: NEGATIVE

## 2017-04-27 LAB — RPR: RPR Ser Ql: NONREACTIVE

## 2017-04-27 LAB — HGB A1C W/O EAG: HEMOGLOBIN A1C: 5.4 % (ref 4.8–5.6)

## 2017-04-27 LAB — B. BURGDORFI ANTIBODIES: Lyme IgG/IgM Ab: <0.91 {ISR} (ref 0.00–0.90)

## 2017-04-27 LAB — HIV ANTIBODY (ROUTINE TESTING W REFLEX): HIV Screen 4th Generation wRfx: NONREACTIVE

## 2017-04-27 LAB — VITAMIN D 25 HYDROXY (VIT D DEFICIENCY, FRACTURES): VIT D 25 HYDROXY: 28 ng/mL — AB (ref 30.0–100.0)

## 2017-04-27 LAB — IMMUNOFIXATION ELECTROPHORESIS
IgA/Immunoglobulin A, Serum: 128 mg/dL (ref 87–352)
IgG (Immunoglobin G), Serum: 1161 mg/dL (ref 700–1600)
IgM (Immunoglobulin M), Srm: 37 mg/dL (ref 26–217)
TOTAL PROTEIN: 7.1 g/dL (ref 6.0–8.5)

## 2017-04-27 LAB — HEPATITIS C ANTIBODY: HEP C VIRUS AB: 1.6 {s_co_ratio} — AB (ref 0.0–0.9)

## 2017-04-27 LAB — SEDIMENTATION RATE: Sed Rate: 2 mm/hr (ref 0–40)

## 2017-04-27 LAB — VITAMIN B12: VITAMIN B 12: 494 pg/mL (ref 232–1245)

## 2017-04-27 LAB — COPPER, SERUM: Copper: 142 ug/dL (ref 72–166)

## 2017-05-22 ENCOUNTER — Other Ambulatory Visit: Payer: Self-pay | Admitting: Neurosurgery

## 2017-05-22 DIAGNOSIS — M4722 Other spondylosis with radiculopathy, cervical region: Secondary | ICD-10-CM

## 2017-05-25 ENCOUNTER — Other Ambulatory Visit: Payer: BLUE CROSS/BLUE SHIELD

## 2017-05-26 ENCOUNTER — Ambulatory Visit
Admission: RE | Admit: 2017-05-26 | Discharge: 2017-05-26 | Disposition: A | Discharge status: Home / Self Care | Payer: BLUE CROSS/BLUE SHIELD | Source: Ambulatory Visit | Attending: Neurosurgery | Admitting: Neurosurgery

## 2017-05-26 DIAGNOSIS — M4722 Other spondylosis with radiculopathy, cervical region: Secondary | ICD-10-CM

## 2017-06-02 DIAGNOSIS — Z0289 Encounter for other administrative examinations: Secondary | ICD-10-CM

## 2017-07-22 ENCOUNTER — Ambulatory Visit: Payer: BLUE CROSS/BLUE SHIELD | Admitting: Neurology

## 2017-07-22 ENCOUNTER — Encounter: Payer: Self-pay | Admitting: Neurology

## 2017-07-22 VITALS — BP 147/79 | HR 71 | Ht 67.0 in | Wt 184.0 lb

## 2017-07-22 DIAGNOSIS — M542 Cervicalgia: Secondary | ICD-10-CM | POA: Diagnosis not present

## 2017-07-22 DIAGNOSIS — R202 Paresthesia of skin: Secondary | ICD-10-CM

## 2017-07-22 NOTE — Progress Notes (Signed)
PATIENT: Haley Valdez DOB: Apr 26, 1961  Chief Complaint  Patient presents with  . Numbness    She has been trying reduce stress.  No further issues with slurred speech.  She does report having daily headaches.     HISTORICAL  Haley Valdez Haley Valdez is a 57 year old female, seen in refer by her primary care doctor [name].  for evaluation of transient ischemic attack, initial evaluation was on April 22, 2017.  I reviewed and summarized the referring note, she has past medical history of hypothyroidism, peripheral neuropathy, hyperlipidemia,  She works as a Copywriter, advertising for many years, around 1995, she began to experience neck pain, intermittent bilateral feet and hand paresthesia, began to seek neurological care, per patient, patient was diagnosed was carpal tunnel syndromes, peripheral neuropathy,  Then she began to develop unsteady gait,  began to fall since age 32,  She was also diagnosed with hepatitis C received treatment around 2008, around that time, she complains of excessive fatigue, generalized weakness,  In 2015, with her abnormal neck posturing, persistent neck pain, she received Botox injection in May 2015, responded very well, second injection in August 2015, has caused left neck pain, swollen, weakness, has to hold her head up with her hand  Since August 2015, she complains of frequent left-sided neck pain, radiating pain to left occipital, left parietal region, left side headaches,  She had extensive evaluations, I was able to review outside MRI cervical report October 2015, Left C3-4 facet abnormality is deep to the tender palpable  abnormality. The facet is overgrown due to degenerative change. In addition, there is bone marrow and adjacent soft tissue edema and enhancement suggesting active osteoarthritis. Underlying infection not excluded but considered less likely. Correlate with symptoms. Nevidence of discitis or abscess. There is cervical  spondylosis. No other acute abnormality.  No mass or adenopathy detected.  MRI brian in Oct 2018: Mild nonspecific supratentorial small vessel disease, no contrast enhancement,  Repeat MRI in [place] in June 2018.  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, with severe left foraminal stenosis, multilevel degenerative disc disease, she is now referred to neurosurgeon, pain management for epidural injection  She continued complaints of intermittent bilateral hand and feet paresthesia, gait abnormality, but today's neurological examination are fairly normal,  Laboratory evaluations in October 2018, INR 0.9 normal CBC,  Update July 22, 2017: She is alone at today's clinical visit, pressed speech, volunteer a lot of informations, long history of ADHD, taking Adderall 10 mg daily, also on polypharmacy treatment, chronic methadone treatment 10 mg 3 times a day, also taking Ativan 1 mg every 8 hours, Cymbalta 60 mg daily, Neurontin 300 mg 3 times a day  She continue complains of neck pain, radiating pain to left shoulder, drop things from her left hand,   I was able to review the EMG nerve conduction study dated April 09, 2017 by Dr. 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  She previously received the Botox injection for her neck pain, abnormal neck posturing in May again August 2015 by outside neurologist [name], [name], I was able  to review injection note, 1. Bilateral Upper trapezius received 40 units each side 2. Splenius Capiti received 20  units each side 3. Longissimus 20 units each side 4. Semispinalis Capitis 20 units side  She reported since her injection in August 2015, she had not at left neck from injection, been persistent, continue have significant neck pain, frequent headaches,  We have personally reviewed MRI of the brain with and without contrast in October 2018: No acute abnormality, scattered subcortical hyperintense T2 signal changes, largest is at left anterior temporal lobe, no contrast enhancement, most consistent with small vessel disease.  MRI of cervical spine multilevel degenerative changes, left facet arthritis at C3-4 with severe left foraminal stenosis, there is no significant canal stenosis.  Laboratory evaluation showed normal or negative protein electrophoresis, Lyme titer, A1c, B12, mildly low vitamin D 28, ESR, RPR, HIV, C-reactive protein, hepatitis, ANA, TSH, copper  REVIEW OF SYSTEMS: Full 14 system review of systems performed and notable only for appetite change, fatigue, cold intolerance, heat intolerance, excessive thirst, eating, flushing, bruise easily  ALLERGIES: Allergies  Allergen Reactions  . Nitrofurantoin Itching    Redness in feet   . Wasp Venom Protein Other (See Comments)    cellutlitis    HOME MEDICATIONS: Current Outpatient Medications  Medication Sig Dispense Refill  . amphetamine-dextroamphetamine (ADDERALL) 10 MG tablet Take 10 mg by mouth daily.    . DULoxetine (CYMBALTA) 60 MG capsule Take 60 mg by mouth daily.    Marland Kitchen gabapentin (NEURONTIN) 300 MG capsule Take 300 mg by mouth 3 (three) times daily.    Marland Kitchen LORazepam (ATIVAN) 1 MG tablet Take 1 mg by mouth every 8 (eight) hours.    . methadone (DOLOPHINE) 10 MG tablet Take 10 mg by mouth every 8 (eight) hours.    Marland Kitchen UNABLE TO FIND Natural Thyroid daily.     No current facility-administered medications for this visit.     PAST MEDICAL HISTORY: Past Medical History:  Diagnosis Date  . ADHD   . Chronic neck pain   .  Depression   . Fibromyalgia   . Hyperlipemia   . Neuropathy   . Raynaud's disease   . Thyroid disease     PAST SURGICAL HISTORY: Past Surgical History:  Procedure Laterality Date  . arm surgery Right    Fracture  . CESAREAN SECTION    . LEG SURGERY Right    Fracture  . TUBAL LIGATION      FAMILY HISTORY: Family History  Problem Relation Age of Onset  . Heart attack Mother   . Stroke Father     SOCIAL HISTORY:  Social History   Socioeconomic History  . Marital status: Married    Spouse name: Not on file  . Number of children: 2  . Years of education: 24  . Highest education level: Associate degree: occupational, Hotel manager, or vocational program  Social Needs  . Financial resource strain: Not on file  . Food insecurity - worry: Not on file  . Food insecurity - inability: Not on file  . Transportation needs - medical: Not on file  . Transportation needs - non-medical: Not on file  Occupational History  . Occupation: Designer, multimedia  Tobacco Use  . Smoking status: Current Every Day Smoker    Packs/day: 1.00    Types: Cigarettes  . Smokeless tobacco: Never Used  Substance and Sexual Activity  . Alcohol use: Yes    Comment: occ  . Drug use: No  . Sexual activity: Not on  file  Other Topics Concern  . Not on file  Social History Narrative   Lives at home with her boyfriend.   Right-handed.   1 cup caffeine per day.     PHYSICAL EXAM   Vitals:   07/22/17 0927  BP: (!) 147/79  Pulse: 71  Weight: 184 lb (83.5 kg)  Height: 5' 7" (1.702 m)    Not recorded      Body mass index is 28.82 kg/m.  PHYSICAL EXAMNIATION:  Gen: NAD, conversant, well nourised, obese, well groomed                     Cardiovascular: Regular rate rhythm, no peripheral edema, warm, nontender. Eyes: Conjunctivae clear without exudates or hemorrhage Neck: Supple, no carotid bruits. Pulmonary: Clear to auscultation bilaterally   NEUROLOGICAL EXAM:  MENTAL  STATUS: Speech:    Speech is normal; fluent and spontaneous with normal comprehension.  Cognition:     Orientation to time, place and person     Normal recent and remote memory     Normal Attention span and concentration     Normal Language, naming, repeating,spontaneous speech     Fund of knowledge   CRANIAL NERVES: CN II: Visual fields are full to confrontation. Fundoscopic exam is normal with sharp discs and no vascular changes. Pupils are round equal and briskly reactive to light. CN III, IV, VI: extraocular movement are normal. No ptosis. CN V: Facial sensation is intact to pinprick in all 3 divisions bilaterally. Corneal responses are intact.  CN VII: Face is symmetric with normal eye closure and smile. CN VIII: Hearing is normal to rubbing fingers CN IX, X: Palate elevates symmetrically. Phonation is normal. CN XI: Head turning and shoulder shrug are intact CN XII: Tongue is midline with normal movements and no atrophy.  MOTOR: There is no pronator drift of out-stretched arms. Muscle bulk and tone are normal. Muscle strength is normal.  REFLEXES: Reflexes are 2+ and symmetric at the biceps, triceps, knees, and ankles. Plantar responses are flexor.  SENSORY: Intact to light touch, pinprick, positional sensation and vibratory sensation are intact in fingers and toes.  COORDINATION: Rapid alternating movements and fine finger movements are intact. There is no dysmetria on finger-to-nose and heel-knee-shin.    GAIT/STANCE: Posture is normal. Gait is steady with normal steps, base, arm swing, and turning. Heel and toe walking are normal. Tandem gait is normal.  Romberg is absent.   DIAGNOSTIC DATA (LABS, IMAGING, TESTING) - I reviewed patient records, labs, notes, testing and imaging myself where available.   ASSESSMENT AND PLAN  Jazyiah Yiu is a 57 y.o. female   Constellation of complaints, including left-sided neck pain, chronic headaches,  EMG nerve conduction study  for cervical radiculopathy  Continue Cymbalta 60 mg daily, gabapentin 300 mg 3 times daily,  Neck stretching exercise   Marcial Pacas, M.D. Ph.D.  [facility] [address] [place], [place] [address] Ph: [phone] Fax: [phone]  CC: [name]., MD

## 2017-08-07 ENCOUNTER — Ambulatory Visit: Payer: BLUE CROSS/BLUE SHIELD | Admitting: Neurology

## 2017-08-07 ENCOUNTER — Ambulatory Visit (INDEPENDENT_AMBULATORY_CARE_PROVIDER_SITE_OTHER): Payer: BLUE CROSS/BLUE SHIELD | Admitting: Neurology

## 2017-08-07 DIAGNOSIS — R202 Paresthesia of skin: Secondary | ICD-10-CM | POA: Diagnosis not present

## 2017-08-07 DIAGNOSIS — G6181 Chronic inflammatory demyelinating polyneuritis: Secondary | ICD-10-CM | POA: Insufficient documentation

## 2017-08-07 DIAGNOSIS — G629 Polyneuropathy, unspecified: Secondary | ICD-10-CM

## 2017-08-07 DIAGNOSIS — M542 Cervicalgia: Secondary | ICD-10-CM

## 2017-08-07 NOTE — Progress Notes (Signed)
PATIENT: Haley Valdez DOB: [birth year]  No chief complaint on file.    HISTORICAL  Haley Valdez is a 57 year old female, seen in refer by her primary care doctor [name].  for evaluation of transient ischemic attack, initial evaluation was on April 22, 2017.  I reviewed and summarized the referring note, she has past medical history of hypothyroidism, peripheral neuropathy, hyperlipidemia,  She works as a Copywriter, advertising for many years, around 1995, she began to experience neck pain, intermittent bilateral feet and hand paresthesia, began to seek neurological care, per patient, patient was diagnosed was carpal tunnel syndromes, peripheral neuropathy,  Then she began to develop unsteady gait,  began to fall since age 40,  She was also diagnosed with hepatitis C received treatment around 2008, around that time, she complains of excessive fatigue, generalized weakness,  In 2015, with her abnormal neck posturing, persistent neck pain, she received Botox injection in May 2015, responded very well, second injection in August 2015, has caused left neck pain, swollen, weakness, has to hold her head up with her hand  Since August 2015, she complains of frequent left-sided neck pain, radiating pain to left occipital, left parietal region, left side headaches,  She had extensive evaluations, I was able to review outside MRI cervical report October 2015, Left C3-4 facet abnormality is deep to the tender palpable  abnormality. The facet is overgrown due to degenerative change. In addition, there is bone marrow and adjacent soft tissue edema and enhancement suggesting active osteoarthritis. Underlying infection not excluded but considered less likely. Correlate with symptoms. Nevidence of discitis or abscess. There is cervical spondylosis. No other acute abnormality.  No mass or adenopathy detected.  MRI brian in Oct 2018: Mild nonspecific supratentorial small vessel disease, no  contrast enhancement,  Repeat MRI in [facility] in June 2018.  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, with severe left foraminal stenosis, multilevel degenerative disc disease, she is now referred to neurosurgeon, pain management for epidural injection  She continued complaints of intermittent bilateral hand and feet paresthesia, gait abnormality, but today's neurological examination are fairly normal,  Laboratory evaluations in October 2018, INR 0.9 normal CBC,  Update July 22, 2017: She is alone at today's clinical visit, pressed speech, volunteer a lot of informations, long history of ADHD, taking Adderall 10 mg daily, also on polypharmacy treatment, chronic methadone treatment 10 mg 3 times a day, also taking Ativan 1 mg every 8 hours, Cymbalta 60 mg daily, Neurontin 300 mg 3 times a day  She continue complains of neck pain, radiating pain to left shoulder, drop things from her left hand,   I was able to review the EMG nerve conduction study dated April 09, 2017 by Dr. 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  She previously received the Botox injection for her neck pain, abnormal neck posturing in May again August 2015 by outside neurologist [name], [name], I was able to review injection note, 1. Bilateral Upper trapezius received 40 units each side 2. Splenius Capiti received 20 units each side 3. Longissimus 20 units each side 4.  Semispinalis Capitis 20 units side  She reported since her injection in August 2015, she had not at  left neck from injection, been persistent, continue have significant neck pain, frequent headaches,  We have personally reviewed MRI of the brain with and without contrast in October 2018: No acute abnormality, scattered subcortical hyperintense T2 signal changes, largest is at left anterior temporal lobe, no contrast enhancement, most consistent with small vessel disease.  MRI of cervical spine multilevel degenerative changes, left facet arthritis at C3-4 with severe left foraminal stenosis, there is no significant canal stenosis.  Laboratory evaluation showed normal or negative protein electrophoresis, Lyme titer, A1c, B12, mildly low vitamin D 28, ESR, RPR, HIV, C-reactive protein, hepatitis, ANA, TSH, copper  Update August 07, 2017: She return for electrodiagnostic study today, which showed evidence of significant neuropathy, there was mixed axonal and demyelinating natures, as evident by significantly prolonged distal latency at all the motor nerves tested, slow conduction velocity in the range of 30 m/s, and significantly prolonged F-wave latency, there is also evidence of some temporal dispersion waveforms  She was noted to have less dependent sensory loss, decreased reflexes, positive Romberg signs.  REVIEW OF SYSTEMS: Full 14 system review of systems performed and notable only for appetite change, fatigue, cold intolerance, heat intolerance, excessive thirst, eating, flushing, bruise easily  ALLERGIES: Allergies  Allergen Reactions  . Nitrofurantoin Itching    Redness in feet   . Wasp Venom Protein Other (See Comments)    cellutlitis    HOME MEDICATIONS: Current Outpatient Medications  Medication Sig Dispense Refill  . amphetamine-dextroamphetamine (ADDERALL) 10 MG tablet Take 10 mg by mouth daily.    . DULoxetine (CYMBALTA) 60 MG capsule Take 60 mg by mouth daily.    Marland Kitchen gabapentin (NEURONTIN) 300 MG capsule Take 300 mg by mouth 3 (three) times daily.    Marland Kitchen LORazepam (ATIVAN) 1 MG  tablet Take 1 mg by mouth every 8 (eight) hours.    . methadone (DOLOPHINE) 10 MG tablet Take 10 mg by mouth every 8 (eight) hours.    Marland Kitchen UNABLE TO FIND Natural Thyroid daily.     No current facility-administered medications for this visit.     PAST MEDICAL HISTORY: Past Medical History:  Diagnosis Date  . ADHD   . Chronic neck pain   . Depression   . Fibromyalgia   . Hyperlipemia   . Neuropathy   . Raynaud's disease   . Thyroid disease     PAST SURGICAL HISTORY: Past Surgical History:  Procedure Laterality Date  . arm surgery Right    Fracture  . CESAREAN SECTION    . LEG SURGERY Right    Fracture  . TUBAL LIGATION      FAMILY HISTORY: Family History  Problem Relation Age of Onset  . Heart attack Mother   . Stroke Father     SOCIAL HISTORY:  Social History   Socioeconomic History  . Marital status: Married    Spouse name: Not on file  . Number of children: 2  . Years of education: 57  . Highest education level: Associate degree: occupational, Hotel manager, or vocational program  Occupational History  . Occupation: [employer]  . Financial resource strain: Not on file  . Food insecurity:    Worry: Not on file    Inability: Not on file  . Transportation needs:    Medical: Not on file    Non-medical: Not on file  Tobacco Use  . Smoking  status: Current Every Day Smoker    Packs/day: 1.00    Types: Cigarettes  . Smokeless tobacco: Never Used  Substance and Sexual Activity  . Alcohol use: Yes    Comment: occ  . Drug use: No  . Sexual activity: Not on file  Lifestyle  . Physical activity:    Days per week: Not on file    Minutes per session: Not on file  . Stress: Not on file  Relationships  . Social connections:    Talks on phone: Not on file    Gets together: Not on file    Attends religious service: Not on file    Active member of club or organization: Not on file    Attends meetings of clubs or organizations: Not on file     Relationship status: Not on file  . Intimate partner violence:    Fear of current or ex partner: Not on file    Emotionally abused: Not on file    Physically abused: Not on file    Forced sexual activity: Not on file  Other Topics Concern  . Not on file  Social History Narrative   Lives at home with her boyfriend.   Right-handed.   1 cup caffeine per day.     PHYSICAL EXAM   There were no vitals filed for this visit.  Not recorded      There is no height or weight on file to calculate BMI.  PHYSICAL EXAMNIATION:  Gen: NAD, conversant, well nourised, obese, well groomed                     Cardiovascular: Regular rate rhythm, no peripheral edema, warm, nontender. Eyes: Conjunctivae clear without exudates or hemorrhage Neck: Supple, no carotid bruits. Pulmonary: Clear to auscultation bilaterally   NEUROLOGICAL EXAM:  MENTAL STATUS: Speech:    Speech is normal; fluent and spontaneous with normal comprehension.  Cognition:     Orientation to time, place and person     Normal recent and remote memory     Normal Attention span and concentration     Normal Language, naming, repeating,spontaneous speech     Fund of knowledge   CRANIAL NERVES: CN II: Visual fields are full to confrontation. Fundoscopic exam is normal with sharp discs and no vascular changes. Pupils are round equal and briskly reactive to light. CN III, IV, VI: extraocular movement are normal. No ptosis. CN V: Facial sensation is intact to pinprick in all 3 divisions bilaterally. Corneal responses are intact.  CN VII: Face is symmetric with normal eye closure and smile. CN VIII: Hearing is normal to rubbing fingers CN IX, X: Palate elevates symmetrically. Phonation is normal. CN XI: Head turning and shoulder shrug are intact CN XII: Tongue is midline with normal movements and no atrophy.  MOTOR: There is no pronator drift of out-stretched arms. Muscle bulk and tone are normal. Muscle strength is  normal.  REFLEXES: Reflexes are 1 and symmetric at the biceps, triceps, knees, and absent at ankles. Plantar responses are flexor.  SENSORY:  Length dependent decreased to light touch, pinprick, vibratory sensation at the toes  COORDINATION: Rapid alternating movements and fine finger movements are intact. There is no dysmetria on finger-to-nose and heel-knee-shin.    GAIT/STANCE: Posture is normal. Gait is steady with normal steps, base, arm swing, and turning. Heel and toe walking are normal. Tandem gait is normal.  Romberg is positive   DIAGNOSTIC DATA (LABS, IMAGING, TESTING) - I reviewed  patient records, labs, notes, testing and imaging myself where available.   ASSESSMENT AND PLAN  Haley Valdez is a 57 y.o. female    Paresthesia, Unbalance gait  Electrodiagnostic study today raised the possibility of demyelinating poly-radicular neuropathy  Complete laboratory evaluations  Lumbar puncture if there is evidence of elevated total protein, may consider IVIG treatment   Marcial Pacas, M.D. Ph.D.  [facility] [address], [place], [place] [address] Ph: [phone] Fax: [phone]  CC: [name]., MD

## 2017-08-07 NOTE — Progress Notes (Signed)
PATIENT: Haley Valdez DOB: Apr 15, 1961  No chief complaint on file.    HISTORICAL  Haley Valdez is a 57 year old female, seen in refer by her primary care doctor [name].  for evaluation of transient ischemic attack, initial evaluation was on April 22, 2017.  I reviewed and summarized the referring note, she has past medical history of hypothyroidism, peripheral neuropathy, hyperlipidemia,  She works as a Copywriter, advertising for many years, around 1995, she began to experience neck pain, intermittent bilateral feet and hand paresthesia, began to seek neurological care, per patient, patient was diagnosed was carpal tunnel syndromes, peripheral neuropathy,  Then she began to develop unsteady gait,  began to fall since age 69,  She was also diagnosed with hepatitis C received treatment around 2008, around that time, she complains of excessive fatigue, generalized weakness,  In 2015, with her abnormal neck posturing, persistent neck pain, she received Botox injection in May 2015, responded very well, second injection in August 2015, has caused left neck pain, swollen, weakness, has to hold her head up with her hand  Since August 2015, she complains of frequent left-sided neck pain, radiating pain to left occipital, left parietal region, left side headaches,  She had extensive evaluations, I was able to review outside MRI cervical report October 2015, Left C3-4 facet abnormality is deep to the tender palpable  abnormality. The facet is overgrown due to degenerative change. In addition, there is bone marrow and adjacent soft tissue edema and enhancement suggesting active osteoarthritis. Underlying infection not excluded but considered less likely. Correlate with symptoms. Nevidence of discitis or abscess. There is cervical spondylosis. No other acute abnormality.  No mass or adenopathy detected.  MRI brian in Oct 2018: Mild nonspecific supratentorial small vessel disease, no  contrast enhancement,  Repeat MRI in [place] in June 2018.  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, with severe left foraminal stenosis, multilevel degenerative disc disease, she is now referred to neurosurgeon, pain management for epidural injection  She continued complaints of intermittent bilateral hand and feet paresthesia, gait abnormality, but today's neurological examination are fairly normal,  Laboratory evaluations in October 2018, INR 0.9 normal CBC,  Update July 22, 2017: She is alone at today's clinical visit, pressed speech, volunteer a lot of informations, long history of ADHD, taking Adderall 10 mg daily, also on polypharmacy treatment, chronic methadone treatment 10 mg 3 times a day, also taking Ativan 1 mg every 8 hours, Cymbalta 60 mg daily, Neurontin 300 mg 3 times a day  She continue complains of neck pain, radiating pain to left shoulder, drop things from her left hand,   I was able to review the EMG nerve conduction study dated April 09, 2017 by Dr. 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  She previously received the Botox injection for her neck pain, abnormal neck posturing in May again August 2015 by outside neurologist [name], [name], I was able to review injection note, 1. Bilateral Upper trapezius received 40 units each side 2. Splenius Capiti received 20 units each side 3. Longissimus 20 units each side 4.  Semispinalis Capitis 20 units side  She reported since her injection in August 2015, she had not at  left neck from injection, been persistent, continue have significant neck pain, frequent headaches,  We have personally reviewed MRI of the brain with and without contrast in October 2018: No acute abnormality, scattered subcortical hyperintense T2 signal changes, largest is at left anterior temporal lobe, no contrast enhancement, most consistent with small vessel disease.  MRI of cervical spine multilevel degenerative changes, left facet arthritis at C3-4 with severe left foraminal stenosis, there is no significant canal stenosis.  Laboratory evaluation showed normal or negative protein electrophoresis, Lyme titer, A1c, B12, mildly low vitamin D 28, ESR, RPR, HIV, C-reactive protein, hepatitis, ANA, TSH, copper  REVIEW OF SYSTEMS: Full 14 system review of systems performed and notable only for appetite change, fatigue, cold intolerance, heat intolerance, excessive thirst, eating, flushing, bruise easily  ALLERGIES: Allergies  Allergen Reactions  . Nitrofurantoin Itching    Redness in feet   . Wasp Venom Protein Other (See Comments)    cellutlitis    HOME MEDICATIONS: Current Outpatient Medications  Medication Sig Dispense Refill  . amphetamine-dextroamphetamine (ADDERALL) 10 MG tablet Take 10 mg by mouth daily.    . DULoxetine (CYMBALTA) 60 MG capsule Take 60 mg by mouth daily.    Marland Kitchen gabapentin (NEURONTIN) 300 MG capsule Take 300 mg by mouth 3 (three) times daily.    Marland Kitchen LORazepam (ATIVAN) 1 MG tablet Take 1 mg by mouth every 8 (eight) hours.    . methadone (DOLOPHINE) 10 MG tablet Take 10 mg by mouth every 8 (eight) hours.    Marland Kitchen UNABLE TO FIND Natural Thyroid daily.     No current facility-administered medications for this visit.     PAST MEDICAL HISTORY: Past Medical History:  Diagnosis Date  . ADHD   . Chronic neck pain   . Depression   . Fibromyalgia   . Hyperlipemia   . Neuropathy   . Raynaud's disease   . Thyroid disease     PAST SURGICAL HISTORY: Past Surgical  History:  Procedure Laterality Date  . arm surgery Right    Fracture  . CESAREAN SECTION    . LEG SURGERY Right    Fracture  . TUBAL LIGATION      FAMILY HISTORY: Family History  Problem Relation Age of Onset  . Heart attack Mother   . Stroke Father     SOCIAL HISTORY:  Social History   Socioeconomic History  . Marital status: Married    Spouse name: Not on file  . Number of children: 2  . Years of education: 68  . Highest education level: Associate degree: occupational, Hotel manager, or vocational program  Occupational History  . Occupation: [employer]  . Financial resource strain: Not on file  . Food insecurity:    Worry: Not on file    Inability: Not on file  . Transportation needs:    Medical: Not on file    Non-medical: Not on file  Tobacco Use  . Smoking status: Current Every Day Smoker    Packs/day: 1.00    Types: Cigarettes  . Smokeless tobacco: Never Used  Substance and Sexual Activity  . Alcohol use: Yes    Comment: occ  . Drug use: No  . Sexual activity: Not on file  Lifestyle  . Physical activity:    Days per week: Not on file    Minutes per session: Not on file  . Stress:  Not on file  Relationships  . Social connections:    Talks on phone: Not on file    Gets together: Not on file    Attends religious service: Not on file    Active member of club or organization: Not on file    Attends meetings of clubs or organizations: Not on file    Relationship status: Not on file  . Intimate partner violence:    Fear of current or ex partner: Not on file    Emotionally abused: Not on file    Physically abused: Not on file    Forced sexual activity: Not on file  Other Topics Concern  . Not on file  Social History Narrative   Lives at home with her boyfriend.   Right-handed.   1 cup caffeine per day.     PHYSICAL EXAM   There were no vitals filed for this visit.  Not recorded      There is no height or weight on  file to calculate BMI.  PHYSICAL EXAMNIATION:  Gen: NAD, conversant, well nourised, obese, well groomed                     Cardiovascular: Regular rate rhythm, no peripheral edema, warm, nontender. Eyes: Conjunctivae clear without exudates or hemorrhage Neck: Supple, no carotid bruits. Pulmonary: Clear to auscultation bilaterally   NEUROLOGICAL EXAM:  MENTAL STATUS: Speech:    Speech is normal; fluent and spontaneous with normal comprehension.  Cognition:     Orientation to time, place and person     Normal recent and remote memory     Normal Attention span and concentration     Normal Language, naming, repeating,spontaneous speech     Fund of knowledge   CRANIAL NERVES: CN II: Visual fields are full to confrontation. Fundoscopic exam is normal with sharp discs and no vascular changes. Pupils are round equal and briskly reactive to light. CN III, IV, VI: extraocular movement are normal. No ptosis. CN V: Facial sensation is intact to pinprick in all 3 divisions bilaterally. Corneal responses are intact.  CN VII: Face is symmetric with normal eye closure and smile. CN VIII: Hearing is normal to rubbing fingers CN IX, X: Palate elevates symmetrically. Phonation is normal. CN XI: Head turning and shoulder shrug are intact CN XII: Tongue is midline with normal movements and no atrophy.  MOTOR: There is no pronator drift of out-stretched arms. Muscle bulk and tone are normal. Muscle strength is normal.  REFLEXES: Reflexes are 2+ and symmetric at the biceps, triceps, knees, and ankles. Plantar responses are flexor.  SENSORY: Intact to light touch, pinprick, positional sensation and vibratory sensation are intact in fingers and toes.  COORDINATION: Rapid alternating movements and fine finger movements are intact. There is no dysmetria on finger-to-nose and heel-knee-shin.    GAIT/STANCE: Posture is normal. Gait is steady with normal steps, base, arm swing, and turning. Heel and  toe walking are normal. Tandem gait is normal.  Romberg is absent.   DIAGNOSTIC DATA (LABS, IMAGING, TESTING) - I reviewed patient records, labs, notes, testing and imaging myself where available.   ASSESSMENT AND PLAN  Haley Valdez is a 57 y.o. female   Constellation of complaints, including left-sided neck pain, chronic headaches,  EMG nerve conduction study for cervical radiculopathy  Continue Cymbalta 60 mg daily, gabapentin 300 mg 3 times daily,  Neck stretching exercise   Marcial Pacas, M.D. Ph.D.  [facility] [address] [place],  [place] [address] Ph: [phone] Fax: [phone]  CC: [name]., MD

## 2017-08-07 NOTE — Procedures (Signed)
Full Name: Haley PennerMary Valdez Gender: Female MRN #: [id number] Date of Birth: 12-04-2060    Visit Date: 08/07/17 10:04 Age: 57 Years 10 Months Old Examining Physician: Levert FeinsteinYijun Sofiah Lyne, MD  Referring Physician: Dr. Terrace ArabiaYan History: 57 year old female, presented with balance issues, 4 extremity paresthesia  Summary of the tests:  Nerve conduction study: Bilateral sural, superficial peroneal sensory responses were absent.  Bilateral tibial motor responses showed mildly decreased the C map amplitude, right worse than left, with significantly prolonged distal latency, slow conduction velocity, and significantly prolonged F-wave latency.  Bilateral peroneal to EDB motor responses were absent.  Right ulnar sensory responses showed moderately prolonged peak latency, with mildly decreased the snap amplitude.  Right ulnar motor responses also showed significantly prolonged distal latency, with moderately decreased the C map amplitude, slow conduction velocity in the range of 30 m/s.  Right median sensory responses showed significantly prolonged peak latency, with decreased to snap amplitude.  Right median motor responses showed significantly prolonged distal latency, with moderately decreased the C map amplitude, and decreased conduction velocity 32 m/s.  At bilateral tibial motor potential waveforms, there is suggestion of temporal dispersion at the proximal stimulation side.  Electromyography: Selective needle examinations were performed at bilateral lower extremity muscles, right upper extremity muscles, bilateral lumbosacral paraspinals, and right cervical paraspinal muscles.  There is evidence of chronic neuropathic changes at bilateral distal leg muscles, right upper extremity proximal muscles.  But there is no evidence of active denervation.  Conclusion: This is an abnormal study.  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  There is also suggestion of temporal dispersion at bilateral tibial proximal stimulation sites.   ------------------------------- Levert FeinsteinYIjun Camara Renstrom, M.D.  [facility] [address] [place], [place] [address] Tel: [phone] Fax: [phone]        [facility]    Nerve / Sites Muscle Latency Ref. Amplitude Ref. Rel Amp Segments Distance Velocity Ref. Area    ms ms mV mV %  cm m/s m/s mVms  R Median - APB     Wrist APB 8.7 ?4.4 2.8 ?4.0 100 Wrist - APB 7   13.0     Upper arm APB 15.3  2.3  81.9 Upper arm - Wrist 21 32 ?49 10.5  R Ulnar - ADM     Wrist ADM 6.5 ?3.3 3.1 ?6.0 100 Wrist - ADM 7   13.3     B.Elbow ADM 12.3  2.9  92.7 B.Elbow - Wrist 18 31 ?49 13.3     A.Elbow ADM 15.1  2.9  99.9 A.Elbow - B.Elbow 10 36 ?49 14.2         A.Elbow - Wrist      R Peroneal - EDB     Ankle EDB NR ?6.5 NR ?2.0 NR Ankle - EDB 9   NR     Fib head EDB NR  NR  NR Fib head - Ankle   ?44 NR         Pop fossa - Ankle      L Peroneal - EDB     Ankle EDB NR ?6.5 NR ?2.0 NR Ankle - EDB 9   NR     Fib head EDB NR  NR  NR Fib head - Ankle 29 NR ?44 NR         Pop fossa - Ankle  R Tibial - AH     Ankle AH 7.2 ?5.8 1.2 ?4.0 100 Ankle - AH 9   6.2     Pop fossa AH 20.2  0.9  75.2 Pop fossa - Ankle 37 28 ?41 2.0  L Tibial - AH     Ankle AH 9.2 ?5.8 3.4 ?4.0 100 Ankle - AH 9   10.4     Pop fossa AH 19.6  1.6  46.4 Pop fossa - Ankle 37 36 ?41 7.9                 SNC    Nerve / Sites Rec. Site Peak Lat Ref.  Amp Ref. Segments Distance    ms ms V V  cm  R Radial - Anatomical snuff box (Forearm)     Forearm Wrist 3.6 ?2.9 4 ?15 Forearm - Wrist 10  R Sural - Ankle (Calf)     Calf Ankle NR ?4.4 NR ?6 Calf - Ankle 14  L Sural - Ankle (Calf)     Calf Ankle NR ?4.4 NR ?6 Calf - Ankle 14  R Superficial peroneal - Ankle     Lat leg Ankle NR ?4.4 NR ?6 Lat leg - Ankle  14  L Superficial peroneal - Ankle     Lat leg Ankle NR ?4.4 NR ?6 Lat leg - Ankle 14  R Median - Orthodromic (Dig II, Mid palm)     Dig II Wrist 6.3 ?3.4 2 ?10 Dig II - Wrist 13  R Ulnar - Orthodromic, (Dig V, Mid palm)     Dig V Wrist 3.9 ?3.1 4 ?5 Dig V - Wrist 44                              F  Wave    Nerve F Lat Ref.   ms ms  R Ulnar - ADM 41.0 ?32.0  R Tibial - AH 73.1 ?56.0  L Tibial - AH 74.9 ?56.0           EMG full       EMG Summary Table    Spontaneous MUAP Recruitment  Muscle IA Fib PSW Fasc Other Amp Dur. Poly Pattern  R. Tibialis anterior Increased None None None _______ Increased Increased Normal Reduced  R. Tibialis posterior Normal None None None _______ Normal Normal Normal Normal  R. Gastrocnemius (Medial head) Increased None None None _______ Increased Normal Normal Reduced  R. Peroneus longus Normal None None None _______ Normal Normal Normal Normal  R. Vastus lateralis Increased None None None _______ Normal Normal Normal Reduced  R. Biceps femoris (short head) Normal None None None _______ Normal Normal Normal Normal  L. Tibialis anterior Increased None None None _______ Increased Normal Normal Reduced  L. Tibialis posterior Normal None None None _______ Normal Normal Normal Normal  L. Peroneus longus Increased None None None _______ Increased Normal Normal Reduced  L. Gastrocnemius (Medial head) Normal None None None _______ Normal Normal Normal Normal  L. Vastus lateralis Normal None None None _______ Normal Normal Normal Normal  R. Lumbar paraspinals (low) Normal None None None _______ Normal Normal Normal Normal  R. Lumbar paraspinals (mid) Normal None None None _______ Normal Normal Normal Normal  L. Lumbar paraspinals (low) Normal None None None _______ Normal Normal Normal Normal  L. Lumbar paraspinals (mid) Normal None None None _______ Normal Normal Normal Normal  R. First dorsal interosseous Normal None None None _______ Normal  Normal  Normal Normal  R. Pronator teres Normal None None None _______ Normal Normal Normal Normal  R. Biceps brachii Increased None None None _______ Normal Normal Normal Reduced  R. Deltoid Normal None None None _______ Normal Normal Normal Reduced  R. Triceps brachii Normal None None None _______ Normal Normal Normal Reduced  R. Cervical paraspinals Normal None None None _______ Normal Normal Normal Normal

## 2017-08-08 LAB — HEPATITIS PANEL, ACUTE
HEP A IGM: NEGATIVE
HEP B C IGM: NEGATIVE
HEP B S AG: NEGATIVE
HEP C VIRUS AB: 1.7 {s_co_ratio} — AB (ref 0.0–0.9)

## 2017-08-08 LAB — THYROID PANEL WITH TSH
FREE THYROXINE INDEX: 2.9 (ref 1.2–4.9)
T3 Uptake Ratio: 28 % (ref 24–39)
T4, Total: 10.3 ug/dL (ref 4.5–12.0)
TSH: <0.006 u[IU]/mL — ABNORMAL LOW (ref 0.450–4.500)

## 2017-08-10 ENCOUNTER — Telehealth: Payer: Self-pay | Admitting: Neurology

## 2017-08-10 NOTE — Telephone Encounter (Signed)
Please call patient, TSH was significantly decreased, but it was normal total T4, T3 uptake ratio, and free thyroxine index  We can fax thyroid functional test result to her endocrinologist, her primary care physician to further adjusting her thyroid supplement,

## 2017-08-10 NOTE — Telephone Encounter (Signed)
Spoke to patient - she is aware of results and she will call her PCP to schedule follow up.  Results faxed and confirmed to Dr. Dennis BastYuri Cabeza.

## 2017-08-26 ENCOUNTER — Ambulatory Visit
Admission: RE | Admit: 2017-08-26 | Discharge: 2017-08-26 | Disposition: A | Discharge status: Home / Self Care | Payer: BLUE CROSS/BLUE SHIELD | Source: Ambulatory Visit | Attending: Neurology | Admitting: Neurology

## 2017-08-26 VITALS — BP 111/59 | HR 71

## 2017-08-26 DIAGNOSIS — R202 Paresthesia of skin: Secondary | ICD-10-CM

## 2017-08-26 DIAGNOSIS — M542 Cervicalgia: Secondary | ICD-10-CM

## 2017-08-26 DIAGNOSIS — G629 Polyneuropathy, unspecified: Secondary | ICD-10-CM

## 2017-08-26 NOTE — Discharge Instructions (Signed)

## 2017-09-01 ENCOUNTER — Telehealth: Payer: Self-pay | Admitting: Neurology

## 2017-09-01 DIAGNOSIS — R7989 Other specified abnormal findings of blood chemistry: Secondary | ICD-10-CM

## 2017-09-01 NOTE — Telephone Encounter (Signed)
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

## 2017-09-01 NOTE — Telephone Encounter (Signed)
Patient is aware of the information below and verbalized understanding.  She is agreeable to IVIG, endocrinology referral and early follow up.  Her appt has been rescheduled.

## 2017-09-24 ENCOUNTER — Ambulatory Visit: Payer: BLUE CROSS/BLUE SHIELD | Admitting: Neurology

## 2017-09-24 ENCOUNTER — Telehealth: Payer: Self-pay | Admitting: Neurology

## 2017-09-24 LAB — CSF CELL COUNT WITH DIFFERENTIAL
RBC COUNT CSF: 0 {cells}/uL (ref 0–10)
WBC, CSF: 0 cells/uL (ref 0–5)

## 2017-09-24 LAB — GRAM STAIN
MICRO NUMBER: 90501011
SPECIMEN QUALITY: ADEQUATE

## 2017-09-24 LAB — FUNGUS CULTURE W SMEAR
MICRO NUMBER: 90501012
SMEAR: NONE SEEN
SPECIMEN QUALITY: ADEQUATE

## 2017-09-24 LAB — GLUCOSE, CSF: Glucose, CSF: 70 mg/dL (ref 40–80)

## 2017-09-24 LAB — PROTEIN, CSF: TOTAL PROTEIN, CSF: 110 mg/dL — AB (ref 15–45)

## 2017-09-24 LAB — VDRL, CSF: SYPHILIS VDRL QUANT CSF: NONREACTIVE

## 2017-09-24 NOTE — Telephone Encounter (Signed)
Patient arrived 17 min late for her apt. Needs a follow up in 1 month, next available was in Sept. Please call back to work in. Best call back [phone]

## 2017-09-24 NOTE — Telephone Encounter (Signed)
Spoke to patient - her appt has been moved to 10/19/17 with Dr. Terrace Arabia.  Diplomat is going to reaching out to her to schedule the IVIG.  She is aware to expect a call from them.

## 2017-10-08 ENCOUNTER — Ambulatory Visit (INDEPENDENT_AMBULATORY_CARE_PROVIDER_SITE_OTHER): Payer: BLUE CROSS/BLUE SHIELD | Admitting: Endocrinology

## 2017-10-08 ENCOUNTER — Other Ambulatory Visit (INDEPENDENT_AMBULATORY_CARE_PROVIDER_SITE_OTHER): Payer: BLUE CROSS/BLUE SHIELD

## 2017-10-08 ENCOUNTER — Encounter: Payer: Self-pay | Admitting: Endocrinology

## 2017-10-08 VITALS — BP 140/70 | HR 76 | Ht 67.0 in | Wt 186.2 lb

## 2017-10-08 DIAGNOSIS — E063 Autoimmune thyroiditis: Secondary | ICD-10-CM | POA: Diagnosis not present

## 2017-10-08 NOTE — Progress Notes (Addendum)
Patient ID: Haley Valdez, female   DOB: 30-Dec-1960, 57 y.o.   MRN: [id number]           Referring Physician: Dr. Terrace Arabia  Reason for Appointment:  Hypothyroidism, new visit    History of Present Illness:   Hypothyroidism was first diagnosed at age 58  At the time of diagnosis patient was having symptoms of mostly depression and also some tendency to weight gain Symptoms started relatively quickly and not related to a pregnancy just before the symptoms started Since she did not improve with antidepressant treatment thyroid levels were checked and she was found to have hypothyroidism, no details available          The patient has been treated with  levothyroxine initially  With starting thyroid supplementation the patient's symptoms were better but she does not think she felt back to normal for quite some time  She subsequently had other medical issues including hepatitis C, peripheral neuropathy which affected her well-being and causing her to have pain and fatigue  She thinks that in early 2018 her PCP changed her from Synthroid 125 mcg daily to NP thyroid since she was not feeling well with fatigue However not clear why her TSH had gone up to 82 at the time of the change She thinks that subsequently she started feeling better and was able to lose weight also  However review of her labs indicate that her TSH has been periodically suppressed below normal subsequently Currently she is taking 120 mg of and the thyroid, 7-1/2 tablets a week  She continues to have some fatigue which is significant, also has issues with pain, depression, mild heat intolerance and difficulty with losing weight No complaints of tremor or palpitations  Although her TSH was normal in 2/19 her free T3 and free T4 levels were below normal from her PCP office Follow-up labs about 2 months later showed normal free T4 index but undetectable TSH, no recent T3 available         Patient's weight history is as  follows:  Wt Readings from Last 3 Encounters:  10/08/17 186 lb 3.2 oz (84.5 kg)  07/22/17 184 lb (83.5 kg)  04/22/17 174 lb (78.9 kg)    Thyroid function results from outside labs have been as follows:  Date   free T4   TSH   T3   06/16/2017  <0.3  3.8 2.2  01/28/2017  0.9 <0.01  11/11/2016 ?  0.017    05/19/2016 ?  81.7   04/05/2013     9.9      Lab Results  Component Value Date   TSH <0.006 (L) 08/07/2017     Past Medical History:  Diagnosis Date  . ADHD   . Chronic neck pain   . Depression   . Fibromyalgia   . Hyperlipemia   . Neuropathy   . Raynaud's disease   . Thyroid disease     Past Surgical History:  Procedure Laterality Date  . arm surgery Right    Fracture  . CESAREAN SECTION    . LEG SURGERY Right    Fracture  . TUBAL LIGATION      Family History  Problem Relation Age of Onset  . Heart attack Mother   . Stroke Father     Social History:  reports that she has been smoking cigarettes.  She has been smoking about 1.00 pack per day. She has never used smokeless tobacco. She reports that she drinks alcohol. She reports that she  does not use drugs.  Allergies:  Allergies  Allergen Reactions  . Nitrofurantoin Itching    Redness in feet   . Wasp Venom Protein Other (See Comments)    cellutlitis    Allergies as of 10/08/2017      Reactions   Nitrofurantoin Itching   Redness in feet   Wasp Venom Protein Other (See Comments)   cellutlitis      Medication List        Accurate as of 10/08/17  4:49 PM. Always use your most recent med list.          amphetamine-dextroamphetamine 10 MG tablet Commonly known as:  ADDERALL Take 10 mg by mouth daily.   DULoxetine 60 MG capsule Commonly known as:  CYMBALTA Take 60 mg by mouth daily.   gabapentin 300 MG capsule Commonly known as:  NEURONTIN Take 300 mg by mouth 3 (three) times daily.   LORazepam 1 MG tablet Commonly known as:  ATIVAN Take 1 mg by mouth every 8 (eight) hours.   methadone  10 MG tablet Commonly known as:  DOLOPHINE Take 10 mg by mouth every 8 (eight) hours.   NP THYROID 120 MG tablet Generic drug:  thyroid Take 120 mg by mouth daily before breakfast.          Review of Systems  Constitutional: Positive for weight gain.  HENT: Negative for hoarseness and trouble swallowing.   Eyes: Negative for blurred vision.  Respiratory: Negative for shortness of breath.   Cardiovascular: Negative for palpitations.  Gastrointestinal: Positive for constipation.  Endocrine: Positive for fatigue and heat intolerance.  Skin:       She thinks her feet get discolored, thickened and have cracks.  Also her hands get very cold and blue if exposed to cold environment, has been told to have Raynaud's phenomenon  Neurological: Positive for tingling.       Has had peripheral neuropathy, reportedly demyelinating for several years  Psychiatric/Behavioral: Positive for depressed mood.       She has had a lot of stress lately                Examination:    BP 140/70 (BP Location: Left Arm, Patient Position: Sitting, Cuff Size: Normal)   Pulse 76   Ht 5\' 7"  (1.702 m)   Wt 186 lb 3.2 oz (84.5 kg)   SpO2 98%   BMI 29.16 kg/m   GENERAL:  Large build.  Has mild generalized obesity  No pallor, clubbing, lymphadenopathy or edema.    Skin:  no rash or pigmentation.  Has mild redness of the soles of the feet  EYES:  No prominence of the eyes or swelling of the eyelids  ENT: Oral mucosa and tongue normal.  THYROID:  Not palpable.  HEART:  Normal  S1 and S2; no murmur or click.  CHEST:    Lungs: Vescicular breath sounds heard equally.  No crepitations/ wheeze.  ABDOMEN:  No distention.  Liver and spleen not palpable.  No other mass or tenderness.  NEUROLOGICAL: Reflexes are bilaterally normal to slightly brisk at biceps.  And ankles bilaterally  JOINTS:  Normal peripheral joints.   Assessment:  HYPOTHYROIDISM, autoimmune and long-standing  Despite her TSH  levels being either normal or low she continues to have problems with fatigue, some depression She think that subjectively she does feel better with animal thyroid extract using and the thyroid compared to Synthroid She is now taking NP thyroid, 120 mg, 7-1/2 tablets a week  With this she is getting the equivalent of about 80 mcg of levothyroxine and 20 mcg of liothyronine  However she has had very inconsistent TSH levels even with the same dosage Cannot explain very low levels of T4 and T3 with normal TSH in 2/19 Within 2 months her TSH is now undetectable along with normal free T4 level on the same dosage  She also has multiple other health problems causing her physical symptoms and affecting her energy level  WEIGHT gain: She is not able to be active and has difficulty losing weight, discussed that this is not related to hypothyroidism May benefit from nutritional counseling  Patient was counseled on the targets for thyroid hormones, dosage regimen including for liothyronine and possible side effects of overdosage  PLAN:  Check thyroid levels again including free T4 and free T3 Discussed that her treatment will be best managed with separate prescriptions of levothyroxine and liothyronine based on current T4 and T3 levels Also explained to her that TSH needs to be maintained in the normal range with treatment  She will continue to follow-up with other physicians for management of her chronic problems including depression and anxiety which is currently not being treated  Follow-up in about 6 weeks after dosage change   Reather LittlerAjay Woodfin Kiss 10/08/2017, 4:49 PM   Consultation note copy sent to the PCP and referring physician  Note: This office note was prepared with Dragon voice recognition system technology. Any transcriptional errors that result from this process are unintentional.  Addendum: TSH is low and with T3 level relatively higher than the T4 level we will change her now to  LEVOTHYROXINE 88 mcg daily along with liothyronine 5 mcg, 2 tablets before breakfast and 1 before dinner She needs to make an appointment with labs in 6 weeks  Tahlor Berenguer Lucianne MussKumar

## 2017-10-09 LAB — T4, FREE: FREE T4: 0.63 ng/dL (ref 0.60–1.60)

## 2017-10-09 LAB — TSH: TSH: <0.01 u[IU]/mL — ABNORMAL LOW (ref 0.35–4.50)

## 2017-10-09 LAB — T3, FREE: T3, Free: 3.3 pg/mL (ref 2.3–4.2)

## 2017-10-13 ENCOUNTER — Ambulatory Visit: Payer: BLUE CROSS/BLUE SHIELD | Admitting: Neurology

## 2017-10-19 ENCOUNTER — Encounter: Payer: Self-pay | Admitting: Neurology

## 2017-10-19 ENCOUNTER — Telehealth: Payer: Self-pay | Admitting: *Deleted

## 2017-10-19 ENCOUNTER — Ambulatory Visit: Payer: BLUE CROSS/BLUE SHIELD | Admitting: Neurology

## 2017-10-19 NOTE — Telephone Encounter (Signed)
No showed follow up appointment. 

## 2017-10-20 ENCOUNTER — Encounter: Payer: Self-pay | Admitting: Neurology

## 2017-10-20 ENCOUNTER — Ambulatory Visit: Payer: Self-pay | Admitting: Neurology

## 2017-10-20 ENCOUNTER — Telehealth: Payer: Self-pay | Admitting: *Deleted

## 2017-10-20 NOTE — Telephone Encounter (Signed)
Patient arrived late to appt and was rescheduled.

## 2017-10-20 NOTE — Telephone Encounter (Signed)
No show - patient called at 9:40am to say she would be late to her 9:30am appt.  She was unable to be seen.  She was rescheduled.

## 2017-10-21 ENCOUNTER — Encounter: Payer: Self-pay | Admitting: Neurology

## 2017-10-21 ENCOUNTER — Ambulatory Visit (INDEPENDENT_AMBULATORY_CARE_PROVIDER_SITE_OTHER): Payer: BLUE CROSS/BLUE SHIELD | Admitting: Neurology

## 2017-10-21 VITALS — BP 142/80 | HR 69 | Ht 67.0 in | Wt 187.5 lb

## 2017-10-21 DIAGNOSIS — M542 Cervicalgia: Secondary | ICD-10-CM

## 2017-10-21 DIAGNOSIS — G629 Polyneuropathy, unspecified: Secondary | ICD-10-CM

## 2017-10-21 DIAGNOSIS — G3281 Cerebellar ataxia in diseases classified elsewhere: Secondary | ICD-10-CM | POA: Diagnosis not present

## 2017-10-21 NOTE — Progress Notes (Signed)
PATIENT: Haley Valdez DOB: November 29, 1960  Chief Complaint  Patient presents with  . Peripheral Neuropathy    She had her first round of IVIG on 10/15/17 and 10/16/17.  She is getting it at home by [place]. No noticeable improvements in symptoms yet.  She has been experiencing some increased fatigue since having IVIG.     HISTORICAL  Haley Valdez Haley Valdez is a 57 year old female, seen in refer by her primary care doctor [name].  for evaluation of transient ischemic attack, initial evaluation was on April 22, 2017.  I reviewed and summarized the referring note, she has past medical history of hypothyroidism, peripheral neuropathy, hyperlipidemia,  She works as a Copywriter, advertising for many years, around 1995, she began to experience neck pain, intermittent bilateral feet and hand paresthesia, began to seek neurological care, per patient, patient was diagnosed was carpal tunnel syndromes, peripheral neuropathy,  Then she began to develop unsteady gait,  began to fall since age 4,  She was also diagnosed with hepatitis C received treatment around 2008, around that time, she complains of excessive fatigue, generalized weakness,  In 2015, with her abnormal neck posturing, persistent neck pain, she received Botox injection in May 2015, responded very well, second injection in August 2015, has caused left neck pain, swollen, weakness, has to hold her head up with her hand  Since August 2015, she complains of frequent left-sided neck pain, radiating pain to left occipital, left parietal region, left side headaches,  She had extensive evaluations, I was able to review outside MRI cervical report October 2015, Left C3-4 facet abnormality is deep to the tender palpable  abnormality. The facet is overgrown due to degenerative change. In addition, there is bone marrow and adjacent soft tissue edema and enhancement suggesting active osteoarthritis. Underlying infection not excluded but  considered less likely. Correlate with symptoms. Nevidence of discitis or abscess. There is cervical spondylosis. No other acute abnormality.  No mass or adenopathy detected.  MRI brian in Oct 2018: Mild nonspecific supratentorial small vessel disease, no contrast enhancement,  Repeat MRI in Cervcial in June 2018.  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, with severe left foraminal stenosis, multilevel degenerative disc disease, she is now referred to neurosurgeon, pain management for epidural injection  She continued complaints of intermittent bilateral hand and feet paresthesia, gait abnormality, but today's neurological examination are fairly normal,  Laboratory evaluations in October 2018, INR 0.9 normal CBC,  Update July 22, 2017: She is alone at today's clinical visit, pressed speech, volunteer a lot of informations, long history of ADHD, taking Adderall 10 mg daily, also on polypharmacy treatment, chronic methadone treatment 10 mg 3 times a day, also taking Ativan 1 mg every 8 hours, Cymbalta 60 mg daily, Neurontin 300 mg 3 times a day  She continue complains of neck pain, radiating pain to left shoulder, drop things from her left hand,   I was able to review the EMG nerve conduction study dated April 09, 2017 by Dr. 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  She previously received the Botox injection for  her neck pain, abnormal neck posturing in May again August 2015 by outside neurologist [name], [name], I was able to review  injection note, 1. Bilateral Upper trapezius received 40 units each side 2. Splenius Capiti received 20 units each side 3. Longissimus 20 units each side 4. Semispinalis Capitis 20 units side  She reported since her injection in August 2015, she had not at left neck from injection, been persistent, continue have significant neck pain, frequent headaches,  We have personally reviewed MRI of the brain with and without contrast in October 2018: No acute abnormality, scattered subcortical hyperintense T2 signal changes, largest is at left anterior temporal lobe, no contrast enhancement, most consistent with small vessel disease.  MRI of cervical spine multilevel degenerative changes, left facet arthritis at C3-4 with severe left foraminal stenosis, there is no significant canal stenosis.  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  There is also suggestion of temporal dispersion at bilateral tibial proximal stimulation sites.  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 on October 08, 2017, decreased TSH, there was suggestion of her thyroid supplement change, the patient did not make any changes yet,  She continue has mood variations, press to speech, complains of bilateral feet paresthesia, gait  abnormality, urinary urgency, she did have significant hyperreflexia of bilateral patella but absent ankle reflexes, less dependent sensory changes, MRI of the brain and cervical findings would not explain her hyperreflexia, ordered MRI of thoracic spine   REVIEW OF SYSTEMS: Full 14 system review of systems performed and notable only for as above ALLERGIES: Allergies  Allergen Reactions  . Nitrofurantoin Itching    Redness in feet   . Wasp Venom Protein Other (See Comments)    cellutlitis    HOME MEDICATIONS: Current Outpatient Medications  Medication Sig Dispense Refill  . amphetamine-dextroamphetamine (ADDERALL) 10 MG tablet Take 10 mg by mouth daily.    . DULoxetine (CYMBALTA) 60 MG capsule Take 60 mg by mouth daily.    Marland Kitchen gabapentin (NEURONTIN) 300 MG capsule Take 300 mg by mouth 3 (three) times daily.    Marland Kitchen LORazepam (ATIVAN) 1 MG tablet Take 1 mg by mouth every 8 (eight) hours.    . methadone (DOLOPHINE) 10 MG tablet Take 10 mg by mouth every 8 (eight) hours.    Marland Kitchen thyroid (NP THYROID) 120 MG tablet Take 120 mg by mouth daily before breakfast.    . traZODone (DESYREL) 50 MG tablet Take 50 mg by mouth at bedtime.     No current facility-administered medications for this visit.     PAST MEDICAL HISTORY: Past Medical History:  Diagnosis Date  . ADHD   . Chronic neck pain   . Depression   . Fibromyalgia   . Hyperlipemia   . Neuropathy   . Raynaud's disease   . Thyroid disease     PAST  SURGICAL HISTORY: Past Surgical History:  Procedure Laterality Date  . arm surgery Right    Fracture  . CESAREAN SECTION    . LEG SURGERY Right    Fracture  . TUBAL LIGATION      FAMILY HISTORY: Family History  Problem Relation Age of Onset  . Heart attack Mother   . Thyroid disease Mother   . Stroke Father   . Thyroid disease Sister     SOCIAL HISTORY:  Social History   Socioeconomic History  . Marital status: Married    Spouse name: Not on file  . Number of children:  2  . Years of education: 66  . Highest education level: Associate degree: occupational, Hotel manager, or vocational program  Occupational History  . Occupation: [employer]  . Financial resource strain: Not on file  . Food insecurity:    Worry: Not on file    Inability: Not on file  . Transportation needs:    Medical: Not on file    Non-medical: Not on file  Tobacco Use  . Smoking status: Current Every Day Smoker    Packs/day: 1.00    Types: Cigarettes  . Smokeless tobacco: Never Used  Substance and Sexual Activity  . Alcohol use: Yes    Comment: occ  . Drug use: No  . Sexual activity: Not on file  Lifestyle  . Physical activity:    Days per week: Not on file    Minutes per session: Not on file  . Stress: Not on file  Relationships  . Social connections:    Talks on phone: Not on file    Gets together: Not on file    Attends religious service: Not on file    Active member of club or organization: Not on file    Attends meetings of clubs or organizations: Not on file    Relationship status: Not on file  . Intimate partner violence:    Fear of current or ex partner: Not on file    Emotionally abused: Not on file    Physically abused: Not on file    Forced sexual activity: Not on file  Other Topics Concern  . Not on file  Social History Narrative   Lives at home with her boyfriend.   Right-handed.   1 cup caffeine per day.     PHYSICAL EXAM   Vitals:   10/21/17 1355  BP: (!) 142/80  Pulse: 69  Weight: 187 lb 8 oz (85 kg)  Height: '5\' 7"'  (1.702 m)    Not recorded      Body mass index is 29.37 kg/m.  PHYSICAL EXAMNIATION:  Gen: NAD, conversant, well nourised, obese, well groomed                     Cardiovascular: Regular rate rhythm, no peripheral edema, warm, nontender. Eyes: Conjunctivae clear without exudates or hemorrhage Neck: Supple, no carotid bruits. Pulmonary: Clear to auscultation bilaterally   NEUROLOGICAL  EXAM:  MENTAL STATUS: Speech:    Speech is normal; fluent and spontaneous with normal comprehension.  Cognition:     Orientation to time, place and person     Normal recent and remote memory     Normal Attention span and concentration     Normal Language, naming, repeating,spontaneous speech     Fund of knowledge   CRANIAL NERVES: CN II: Visual fields are full to confrontation. Fundoscopic exam is normal with sharp discs and no vascular changes.  Pupils are round equal and briskly reactive to light. CN III, IV, VI: extraocular movement are normal. No ptosis. CN V: Facial sensation is intact to pinprick in all 3 divisions bilaterally. Corneal responses are intact.  CN VII: Face is symmetric with normal eye closure and smile. CN VIII: Hearing is normal to rubbing fingers CN IX, X: Palate elevates symmetrically. Phonation is normal. CN XI: Head turning and shoulder shrug are intact CN XII: Tongue is midline with normal movements and no atrophy.  MOTOR: There is no pronator drift of out-stretched arms. Muscle bulk and tone are normal. Muscle strength is normal.  REFLEXES: Reflexes are absent and symmetric at the biceps, triceps, 3/3 knees, and absent at bilateral ankle. Plantar responses are flexor.  SENSORY: Length dependent decreased light touch, pinprick and  vibratory sensation to ankle levels  COORDINATION: Rapid alternating movements and fine finger movements are intact. There is no dysmetria on finger-to-nose and heel-knee-shin.    GAIT/STANCE: Mild stiff, steady gait   Romberg is present.   DIAGNOSTIC DATA (LABS, IMAGING, TESTING) - I reviewed patient records, labs, notes, testing and imaging myself where available.   ASSESSMENT AND PLAN  Jeymi Hepp is a 56 y.o. female   Bilateral feet paresthesia, gait abnormality  Electrodiagnostic study on August 07, 2017 showed evidence of CIDP, which is confirmed by spinal fluid testing showed elevated total protein of 110, WBC  of 0  She received her first round of IVIG on June 13, 14 2019, 2 g/kg for IV infusion,  But the atypical features is she has brisk bilateral knee reflexes, which were not explained by her mild abnormality on MRI of the brain, cervical spine, will proceed with MRI of thoracic spine  Abnormal thyroid function  Was seen by her endocrinologist Dr. Dwyane Dee, repeat TSH was less than 0.01, she is continue to work with her endocrinologist and primary care  Marcial Pacas, M.D. Ph.D.  [facility] [address] [place], [place] [address] Ph: [phone] Fax: [phone]  CC: [name]., MD

## 2017-10-22 ENCOUNTER — Telehealth: Payer: Self-pay | Admitting: Neurology

## 2017-10-22 NOTE — Telephone Encounter (Signed)
MR Thoracic spine wo contrast Dr. Mertie ClauseYan [employer] Auth: [id number] (exp. 10/21/17 to 11/19/17. [facility]

## 2017-10-23 ENCOUNTER — Telehealth: Payer: Self-pay | Admitting: Endocrinology

## 2017-10-23 MED ORDER — LEVOTHYROXINE SODIUM 88 MCG PO TABS: 88.0000 ug | ORAL_TABLET | Freq: Every day | ORAL | 0 refills | Status: DC

## 2017-10-23 MED ORDER — LIOTHYRONINE SODIUM 5 MCG PO TABS: ORAL_TABLET | ORAL | 0 refills | Status: AC

## 2017-10-23 NOTE — Addendum Note (Signed)
Addended by: Yolande JollyLAWSON, Fabio Wah on: 10/23/2017 03:37 PM   Modules accepted: Orders

## 2017-10-23 NOTE — Telephone Encounter (Signed)
Sent both medications to requested pharmacy

## 2017-10-23 NOTE — Telephone Encounter (Signed)
Please call in the medications in the last lab note per Dr[facility] Lucianne [facility]ussKumar to [facility] [phone]  Pt is aware of the reasoning for this medication

## 2017-10-27 ENCOUNTER — Ambulatory Visit (INDEPENDENT_AMBULATORY_CARE_PROVIDER_SITE_OTHER): Payer: BLUE CROSS/BLUE SHIELD

## 2017-10-27 DIAGNOSIS — G3281 Cerebellar ataxia in diseases classified elsewhere: Secondary | ICD-10-CM

## 2017-10-29 ENCOUNTER — Telehealth: Payer: Self-pay | Admitting: *Deleted

## 2017-10-29 NOTE — Telephone Encounter (Signed)
-----   Message from Levert FeinsteinYijun Yan, MD sent at 10/29/2017  2:28 PM EDT ----- Please call pt for normal MRI of thoracic spine

## 2017-10-29 NOTE — Telephone Encounter (Signed)
Spoke to patient she is aware of results

## 2017-11-02 ENCOUNTER — Ambulatory Visit: Payer: Self-pay | Admitting: Neurology

## 2017-11-15 ENCOUNTER — Other Ambulatory Visit: Payer: Self-pay | Admitting: Endocrinology

## 2017-11-15 DIAGNOSIS — E063 Autoimmune thyroiditis: Secondary | ICD-10-CM

## 2017-11-17 ENCOUNTER — Telehealth: Payer: Self-pay | Admitting: Neurology

## 2017-11-17 ENCOUNTER — Other Ambulatory Visit (INDEPENDENT_AMBULATORY_CARE_PROVIDER_SITE_OTHER): Payer: BLUE CROSS/BLUE SHIELD

## 2017-11-17 DIAGNOSIS — E063 Autoimmune thyroiditis: Secondary | ICD-10-CM | POA: Diagnosis not present

## 2017-11-17 LAB — TSH

## 2017-11-17 LAB — T3, FREE: T3 FREE: 3.4 pg/mL (ref 2.3–4.2)

## 2017-11-17 LAB — T4, FREE: Free T4: 0.79 ng/dL (ref 0.60–1.60)

## 2017-11-17 MED ORDER — METHYLPREDNISOLONE 4 MG PO TBPK: ORAL_TABLET | ORAL | 0 refills | Status: DC

## 2017-11-17 NOTE — Telephone Encounter (Signed)
Pt had IVIG early in June. For the past 3 weeks an itchy rash has broken out on bil leg below the knee and is painful to the touch, small blisters on the palms of the hands close to the wrist with the skin peeling, itchy and painful. Please call to advise

## 2017-11-17 NOTE — Telephone Encounter (Signed)
Per vo by Dr. Epimenio FootSater, have the patient take the following:  6-day Medrol dose pack per written instructions.  She should also take OTC Benadryl TID and OTC Pepcid or Zantac TID until symptoms resolve.  Patient is agreeable to this plan.  Medrol rx sent to the pharmacy.  Instructed her to call back if she is not improving.

## 2017-11-17 NOTE — Telephone Encounter (Signed)
She had her first IVIG treatment on 10/15/17 and 10/16/17.  One week after her infusion, she developed a mild rash on her bilateral, lower extremities and redness on the palms of her hand.  Her symptoms have continued to progress.  She now has a significant, itchy, painful rash on her legs (below her knees) and has blisters/peeling skin on her palms.  She has no difficulty breathing. She delayed reporting this in hopes that it would resolve.  She did not want to give up IVIG as a treatment option.   She is aware Dr. Terrace ArabiaYan is out of the office this week.  She will hold off on any further IVIG treatments and a long term plan will be discussed with Dr. Terrace ArabiaYan when she returns.  For her acute needs, we will discuss a plan with Dr. Epimenio FootSater.

## 2017-11-17 NOTE — Telephone Encounter (Signed)
Patient would like a call to discuss her long term plan when Dr. Terrace ArabiaYan returns.

## 2017-11-19 ENCOUNTER — Encounter: Payer: Self-pay | Admitting: Endocrinology

## 2017-11-19 ENCOUNTER — Ambulatory Visit (INDEPENDENT_AMBULATORY_CARE_PROVIDER_SITE_OTHER): Payer: BLUE CROSS/BLUE SHIELD | Admitting: Endocrinology

## 2017-11-19 ENCOUNTER — Encounter: Payer: Self-pay | Admitting: *Deleted

## 2017-11-19 VITALS — BP 134/80 | HR 73 | Ht 67.0 in | Wt 195.2 lb

## 2017-11-19 DIAGNOSIS — N951 Menopausal and female climacteric states: Secondary | ICD-10-CM

## 2017-11-19 DIAGNOSIS — R7989 Other specified abnormal findings of blood chemistry: Secondary | ICD-10-CM | POA: Diagnosis not present

## 2017-11-19 DIAGNOSIS — E063 Autoimmune thyroiditis: Secondary | ICD-10-CM | POA: Diagnosis not present

## 2017-11-19 NOTE — Patient Instructions (Signed)
Take 1 cytomel 2x daily

## 2017-11-19 NOTE — Progress Notes (Signed)
Patient ID: Haley PennerMary Valdez, female   DOB: 1961-03-06, 57 y.o.   MRN: [id number]           Referring Physician: Dr. Terrace ArabiaYan  Reason for Appointment:  Hypothyroidism, follow-up visit    History of Present Illness:   Hypothyroidism was first diagnosed at age 57  At the time of diagnosis patient was having symptoms of mostly depression and also some tendency to weight gain Symptoms started relatively quickly and not related to a pregnancy just before the symptoms started Since she did not improve with antidepressant treatment thyroid levels were checked and she was found to have hypothyroidism, no details available          With starting thyroid supplementation with levothyroxine the patient's symptoms were better but she does not think she felt back to normal for quite some time  She subsequently had other medical issues including hepatitis C, peripheral neuropathy which affected her well-being and causing her to have pain and fatigue   RECENT history: She thinks that in early 2018 her PCP changed her from Synthroid 125 mcg daily to NP thyroid since she was not feeling well with fatigue However not clear why her TSH had gone up to 82 at the time of the change She thinks that subsequently she started feeling better and was able to lose weight also Review of her labs indicate that her TSH has been periodically suppressed below normal as indicated below   Because of her undetectable TSH she was changed from the NP thyroid to Cytomel and levothyroxine separately in 6/19 She is taking 10 mg liothyronine in the morning and 5 mg the evening Currently taking 88 mcg of levothyroxine  She continues to have some fatigue but she thinks it is a little better Her main concern is progressive weight gain despite eating small portions  No complaints of shakiness, nervousness or palpitations  However even though her free T4 and free T3 are normal and about the same her TSH is still undetectable           Patient's weight history is as follows:  Wt Readings from Last 3 Encounters:  11/19/17 195 lb 3.2 oz (88.5 kg)  10/21/17 187 lb 8 oz (85 kg)  10/08/17 186 lb 3.2 oz (84.5 kg)    Thyroid function results from outside labs have been as follows:  Date   free T4   TSH   T3   06/16/2017  <0.3  3.8 2.2  01/28/2017  0.9 <0.01  11/11/2016 ?  0.017    05/19/2016 ?  81.7   04/05/2013     9.9      Lab Results  Component Value Date   TSH <0.01 (L) 11/17/2017   TSH <0.01 Repeated and verified X2. (L) 10/08/2017   TSH <0.006 (L) 08/07/2017   FREET4 0.79 11/17/2017   FREET4 0.63 10/08/2017   T3FREE 3.4 11/17/2017   T3FREE 3.3 10/08/2017     Past Medical History:  Diagnosis Date  . ADHD   . Chronic neck pain   . Depression   . Fibromyalgia   . Hyperlipemia   . Neuropathy   . Raynaud's disease   . Thyroid disease     Past Surgical History:  Procedure Laterality Date  . arm surgery Right    Fracture  . CESAREAN SECTION    . LEG SURGERY Right    Fracture  . TUBAL LIGATION      Family History  Problem Relation Age of Onset  .  Heart attack Mother   . Thyroid disease Mother   . Stroke Father   . Thyroid disease Sister     Social History:  reports that she has been smoking cigarettes.  She has been smoking about 1.00 pack per day. She has never used smokeless tobacco. She reports that she drinks alcohol. She reports that she does not use drugs.  Allergies:  Allergies  Allergen Reactions  . Nitrofurantoin Itching    Redness in feet   . Wasp Venom Protein Other (See Comments)    cellutlitis    Allergies as of 11/19/2017      Reactions   Nitrofurantoin Itching   Redness in feet   Wasp Venom Protein Other (See Comments)   cellutlitis      Medication List        Accurate as of 11/19/17  3:03 PM. Always use your most recent med list.          amphetamine-dextroamphetamine 10 MG tablet Commonly known as:  ADDERALL Take 10 mg by mouth daily.   DULoxetine 60 MG  capsule Commonly known as:  CYMBALTA Take 60 mg by mouth daily.   gabapentin 300 MG capsule Commonly known as:  NEURONTIN Take 300 mg by mouth 3 (three) times daily.   levothyroxine 88 MCG tablet Commonly known as:  SYNTHROID, LEVOTHROID Take 1 tablet (88 mcg total) by mouth daily.   liothyronine 5 MCG tablet Commonly known as:  CYTOMEL Take 2 tablets before breakfast and 1 tablet before dinner   LORazepam 1 MG tablet Commonly known as:  ATIVAN Take 1 mg by mouth every 8 (eight) hours.   methadone 10 MG tablet Commonly known as:  DOLOPHINE Take 10 mg by mouth every 8 (eight) hours.   methylPREDNISolone 4 MG Tbpk tablet Commonly known as:  MEDROL Six day dose pack.  Take 6 tabs x 1 day, 5 tabs x 1 day, 4 tabs x 1 day, 3 tabs x 1 day, 2 tabs x 1 day, 1 tab x 1 day, then stop.   NP THYROID 120 MG tablet Generic drug:  thyroid Take 120 mg by mouth daily before breakfast.   traZODone 50 MG tablet Commonly known as:  DESYREL Take 50 mg by mouth at bedtime.          Review of Systems      She has had steroid injection in her cervical spine and is going to start a Medrol Dosepak from her neurologist         Examination:    BP 134/80 (BP Location: Left Arm, Patient Position: Sitting, Cuff Size: Normal)   Pulse 73   Ht 5\' 7"  (1.702 m)   Wt 195 lb 3.2 oz (88.5 kg)   SpO2 97%   BMI 30.57 kg/m    Reflexes are bilaterally normal No tremor Thyroid not palpable   Assessment:  HYPOTHYROIDISM, without a goiter and long-standing  Not clear if she has primary or secondary hypothyroidism on a combination of both  Recently has had persistently low TSH levels despite normal T3 and T4 levels and this is the same now with separately taking liothyronine and 88 mcg of levothyroxine compared to NP thyroid 120 mg previously  She does not appear to have any symptoms of either hypothyroidism or hyperthyroidism Her fatigue and weight gain is likely unrelated and possibly from  getting periodic steroids as well as not exercising and stress   PLAN:  Since she probably has primary hypothyroidism with history of high TSH  will reduce her liothyronine to 2 tablets a day, 1 twice a day We will also need to evaluate for hypopituitarism with [facility] level and also check TPO antibodies on her next visit She does need follow-up in about 2 months  Discussed that she does need to start exercising for weight loss Currently will be difficult to have her lose weight with getting steroids again   Reather Littler 11/19/2017, 3:03 PM    Note: This office note was prepared with Dragon voice recognition system technology. Any transcriptional errors that result from this process are unintentional.    Reather Littler

## 2017-11-23 NOTE — Telephone Encounter (Signed)
Give her neurology follow-up appointment

## 2017-11-24 NOTE — Telephone Encounter (Addendum)
Spoke to patient - her appt has been moved to 11/26/17 at 3pm.  She will arrive to the office early for check-in.  She has only noticed mild improvement in her rash.

## 2017-11-26 ENCOUNTER — Encounter: Payer: Self-pay | Admitting: Neurology

## 2017-11-26 ENCOUNTER — Telehealth: Payer: Self-pay | Admitting: Neurology

## 2017-11-26 ENCOUNTER — Ambulatory Visit (INDEPENDENT_AMBULATORY_CARE_PROVIDER_SITE_OTHER): Payer: BLUE CROSS/BLUE SHIELD | Admitting: Neurology

## 2017-11-26 VITALS — BP 148/72 | HR 88 | Ht 67.0 in | Wt 194.0 lb

## 2017-11-26 DIAGNOSIS — G6181 Chronic inflammatory demyelinating polyneuritis: Secondary | ICD-10-CM | POA: Diagnosis not present

## 2017-11-26 NOTE — Progress Notes (Addendum)
PATIENT: Haley Valdez DOB: [birth year]  Chief Complaint  Patient presents with  . Polyneuropathy    She is here to be evaulated for a recent reaction to IVIG.     HISTORICAL  Haley Valdez is a 57 year old female, seen in refer by her primary care doctor Haley Valdez.  for evaluation of transient ischemic attack, initial evaluation was on April 22, 2017.  I reviewed and summarized the referring note, she has past medical history of hypothyroidism, peripheral neuropathy, hyperlipidemia,  She works as a Copywriter, advertising for many years, around 1995, she began to experience neck pain, intermittent bilateral feet and hand paresthesia, began to seek neurological care, per patient, patient was diagnosed was carpal tunnel syndromes, peripheral neuropathy,  Then she began to develop unsteady gait,  began to fall since age 59,  She was also diagnosed with hepatitis C received treatment around 2008, around that time, she complains of excessive fatigue, generalized weakness,  In 2015, with her abnormal neck posturing, persistent neck pain, she received Botox injection in May 2015, responded very well, second injection in August 2015, has caused left neck pain, swollen, weakness, has to hold her head up with her hand  Since August 2015, she complains of frequent left-sided neck pain, radiating pain to left occipital, left parietal region, left side headaches,  She had extensive evaluations, I was able to review outside MRI cervical report October 2015, Left C3-4 facet abnormality is deep to the tender palpable  abnormality. The facet is overgrown due to degenerative change. In addition, there is bone marrow and adjacent soft tissue edema and enhancement suggesting active osteoarthritis. Underlying infection not excluded but considered less likely. Correlate with symptoms. Nevidence of discitis or abscess. There is cervical spondylosis. No other acute abnormality.  No mass or  adenopathy detected.  MRI brian in Oct 2018: Mild nonspecific supratentorial small vessel disease, no contrast enhancement,  Repeat MRI in Cervcial in June 2018.  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, with severe left foraminal stenosis, multilevel degenerative disc disease, she is now referred to neurosurgeon, pain management for epidural injection  She continued complaints of intermittent bilateral hand and feet paresthesia, gait abnormality, but today's neurological examination are fairly normal,  Laboratory evaluations in October 2018, INR 0.9 normal CBC,  Update July 22, 2017: She is alone at today's clinical visit, pressed speech, volunteer a lot of informations, long history of ADHD, taking Adderall 10 mg daily, also on polypharmacy treatment, chronic methadone treatment 10 mg 3 times a day, also taking Ativan 1 mg every 8 hours, Cymbalta 60 mg daily, Neurontin 300 mg 3 times a day  She continue complains of neck pain, radiating pain to left shoulder, drop things from her left hand,   I was able to review the EMG nerve conduction study dated April 09, 2017 by Dr. Lynden Valdez, there was no evidence of left C5-6 radiculopathy, moderate carpal tunnel at the left wrist, moderate severe left ulnar neuropathy at the wrist, this is based on active neuropathic changes noted in the left deltoid, biceps, triceps, pronator teres, abductor pollicis brevis, and left cervical paraspinals,  The study showed moderately prolonged left median sensory peak latency, with severely prolonged left median motor distal latency, normal C map amplitude, mild slow conduction velocity.  She previously received the Botox injection for her neck pain, abnormal neck posturing in May again August 2015 by outside neurologist Haley Valdez, Haley Valdez, I was able to review injection note, 1. Bilateral  Upper trapezius received 40 units each side 2. Splenius Capiti received 20 units each side 3. Longissimus 20 units each side 4.  Semispinalis Capitis 20 units side  She reported since her injection in August 2015, she had a knot at left neck from injection, been persistent, continue have significant neck pain, frequent headaches,  We have personally reviewed MRI of the brain with and without contrast in October 2018: No acute abnormality, scattered subcortical hyperintense T2 signal changes, largest is at left anterior temporal lobe, no contrast enhancement, most consistent with small vessel disease.  MRI of cervical spine multilevel degenerative changes, left facet arthritis at C3-4 with severe left foraminal stenosis, there is no significant canal stenosis.  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  There is also suggestion of temporal dispersion at bilateral tibial proximal stimulation sites.  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 Elayne Valdez on October 08, 2017, decreased TSH, there was suggestion of her thyroid supplement change, the patient did not make any changes yet,  She continue has mood swings, pressed speech, complains of bilateral feet paresthesia, gait abnormality, urinary urgency, she did have significant hyperreflexia of bilateral patella but absent ankle reflexes, less dependent sensory changes, MRI of the  brain and cervical findings would not explain her hyperreflexia, ordered MRI of thoracic spine  UPDATE November 26 2017: She had her first home IVIG infusion through [facility] on June 13, 14, a week after infusion, she reported doing well, however, about 3 weeks after the infusion, in early July 2019 she noticed a rash broke out at the bottom of her feet, arm, dry scaly skins, there is also raised erythematous dry scaly rash at bilateral leg, she reported similar reaction to interferon for hepatitis C treatment in the past, she was seen by her dermatologist HaleyWalter Elvera Lennox in recent few months, was given the diagnosis of eczema, given a prescription of dexamethasone cream.  Patient stated, [facility] home infusion agent is planning on switching from Octgam to different 10% IVIG privigen, she complains of excessive stress, depression, " she feel manner just want to stay in bed all the time"   IVIG infusion in June did help her some, she feel more energetic,  Less lower extremity spasticity, paresthesia,  MRI of thoracic spine on October 29, 2017 was normal  REVIEW OF SYSTEMS: Full 14 system review of systems performed and notable only for as above ALLERGIES: Allergies  Allergen Reactions  . Nitrofurantoin Itching    Redness in feet   . Wasp Venom Protein Other (See Comments)    cellutlitis    HOME MEDICATIONS: Current Outpatient Medications  Medication Sig Dispense Refill  . amphetamine-dextroamphetamine (ADDERALL) 10 MG tablet Take 10 mg by mouth daily.    . DULoxetine (CYMBALTA) 60 MG capsule Take 60 mg by mouth daily.    Marland Kitchen gabapentin (NEURONTIN) 300 MG capsule Take  300 mg by mouth 3 (three) times daily.    Marland Kitchen levothyroxine (SYNTHROID, LEVOTHROID) 88 MCG tablet Take 1 tablet (88 mcg total) by mouth daily. 90 tablet 0  . liothyronine (CYTOMEL) 5 MCG tablet Take 2 tablets before breakfast and 1 tablet before dinner 270 tablet 0  . LORazepam (ATIVAN) 1 MG tablet Take 1 mg by mouth every 8  (eight) hours.    . methadone (DOLOPHINE) 10 MG tablet Take 10 mg by mouth every 8 (eight) hours.    . methylPREDNISolone (MEDROL) 4 MG TBPK tablet Six day dose pack.  Take 6 tabs x 1 day, 5 tabs x 1 day, 4 tabs x 1 day, 3 tabs x 1 day, 2 tabs x 1 day, 1 tab x 1 day, then stop. 21 tablet 0  . traZODone (DESYREL) 50 MG tablet Take 50 mg by mouth at bedtime.     No current facility-administered medications for this visit.     PAST MEDICAL HISTORY: Past Medical History:  Diagnosis Date  . ADHD   . Chronic neck pain   . Depression   . Fibromyalgia   . Hyperlipemia   . Neuropathy   . Raynaud's disease   . Thyroid disease     PAST SURGICAL HISTORY: Past Surgical History:  Procedure Laterality Date  . arm surgery Right    Fracture  . CESAREAN SECTION    . LEG SURGERY Right    Fracture  . TUBAL LIGATION      FAMILY HISTORY: Family History  Problem Relation Age of Onset  . Heart attack Mother   . Thyroid disease Mother   . Stroke Father   . Thyroid disease Sister     SOCIAL HISTORY:  Social History   Socioeconomic History  . Marital status: Married    Spouse name: Not on file  . Number of children: 2  . Years of education: 4  . Highest education level: Associate degree: occupational, Hotel manager, or vocational program  Occupational History  . Occupation: [employer]  . Financial resource strain: Not on file  . Food insecurity:    Worry: Not on file    Inability: Not on file  . Transportation needs:    Medical: Not on file    Non-medical: Not on file  Tobacco Use  . Smoking status: Current Every Day Smoker    Packs/day: 1.00    Types: Cigarettes  . Smokeless tobacco: Never Used  Substance and Sexual Activity  . Alcohol use: Yes    Comment: occ  . Drug use: No  . Sexual activity: Not on file  Lifestyle  . Physical activity:    Days per week: Not on file    Minutes per session: Not on file  . Stress: Not on file  Relationships  .  Social connections:    Talks on phone: Not on file    Gets together: Not on file    Attends religious service: Not on file    Active member of club or organization: Not on file    Attends meetings of clubs or organizations: Not on file    Relationship status: Not on file  . Intimate partner violence:    Fear of current or ex partner: Not on file    Emotionally abused: Not on file    Physically abused: Not on file    Forced sexual activity: Not on file  Other Topics Concern  . Not on file  Social History Narrative  Lives at home with her boyfriend.   Right-handed.   1 cup caffeine per day.     PHYSICAL EXAM   Vitals:   11/26/17 1522  BP: (!) 148/72  Pulse: 88  Weight: 194 lb (88 kg)  Height: 5' 7" (1.702 m)    Not recorded      Body mass index is 30.38 kg/m.  PHYSICAL EXAMNIATION:  Gen: NAD, conversant, well nourised, obese, well groomed                     Cardiovascular: Regular rate rhythm, no peripheral edema, warm, nontender. Eyes: Conjunctivae clear without exudates or hemorrhage Neck: Supple, no carotid bruits. Pulmonary: Clear to auscultation bilaterally   NEUROLOGICAL EXAM:  MENTAL STATUS: Speech:    Speech is normal; fluent and spontaneous with normal comprehension.  Cognition:     Orientation to time, place and person     Normal recent and remote memory     Normal Attention span and concentration     Normal Language, naming, repeating,spontaneous speech     Fund of knowledge   CRANIAL NERVES: CN II: Visual fields are full to confrontation. Fundoscopic exam is normal with sharp discs and no vascular changes. Pupils are round equal and briskly reactive to light. CN III, IV, VI: extraocular movement are normal. No ptosis. CN V: Facial sensation is intact to pinprick in all 3 divisions bilaterally. Corneal responses are intact.  CN VII: Face is symmetric with normal eye closure and smile. CN VIII: Hearing is normal to rubbing fingers CN IX, X:  Palate elevates symmetrically. Phonation is normal. CN XI: Head turning and shoulder shrug are intact CN XII: Tongue is midline with normal movements and no atrophy.  MOTOR: She has mild bilateral toe extension, flexion weakness  REFLEXES: Reflexes are absent and symmetric at the biceps, triceps, 3/3 knees, and absent at bilateral ankle. Plantar responses are flexor.  SENSORY: Length dependent decreased light touch, pinprick and  vibratory sensation to ankle levels  COORDINATION: Rapid alternating movements and fine finger movements are intact. There is no dysmetria on finger-to-nose and heel-knee-shin.    GAIT/STANCE: Mild stiff, steady gait   Romberg is present.   DIAGNOSTIC DATA (LABS, IMAGING, TESTING) - I reviewed patient records, labs, notes, testing and imaging myself where available.   ASSESSMENT AND PLAN  Josely Hohman is a 57 y.o. female   Bilateral feet paresthesia, gait abnormality  Electrodiagnostic study on August 07, 2017 showed evidence of CIDP, which is confirmed by spinal fluid testing showed elevated total protein of 110, WBC of 0  She received her first round of IVIG on June 13, 14 2019, 2 g/kg for IV infusion,  The atypical features is significant hyperreflexia of bilateral upper extremity and patella, there was no significant pathology on MRI of the brain, cervical, thoracic spine.  She complains of worsening eczema, bilateral lower extremity skin rash with recent IVIG in June 2019, will get medical record from her dermatologist Dr. Whitworth, continue ivig,   Return to clinic in 3 months  Abnormal thyroid function  Was seen by her endocrinologist Dr. Kumar, repeat TSH was less than 0.01, she will continue to work with her endocrinologist and primary care  Yijun Yan, M.D. Ph.D.  [facility] [address], [address] [place], [place] [address] Ph: [phone] Fax: [phone]  CC: Cabeza, Yuri M., MD  I was able to review  dermatologist Dr. Whitworth evaluation in October 2018, erythematous scaly patches at the right   mid inferior lateral plantar foot, left heel, diagnosis was dermatitis, has used antifungals without relief, was given topical steroid, 0.05% betamethasone ointment, KOH exam under high dry magnification light microscope was negative.

## 2017-11-26 NOTE — Telephone Encounter (Signed)
Get medical record from her dermatologist  Aris LotWhitworth, Walter, MD

## 2017-11-26 NOTE — Telephone Encounter (Addendum)
Medical release form signed by patient and faxed to Dr. Marcelyn BruinsWhitworth's office at [phone] [facility]    Fax receipt confirmed.

## 2017-11-26 NOTE — Telephone Encounter (Signed)
Records received and provided to Dr. Yan for review.  

## 2018-01-18 ENCOUNTER — Other Ambulatory Visit: Payer: BLUE CROSS/BLUE SHIELD

## 2018-01-20 ENCOUNTER — Ambulatory Visit: Payer: BLUE CROSS/BLUE SHIELD | Admitting: Endocrinology

## 2018-01-20 DIAGNOSIS — Z0289 Encounter for other administrative examinations: Secondary | ICD-10-CM

## 2018-01-21 ENCOUNTER — Other Ambulatory Visit: Payer: Self-pay | Admitting: Neurology

## 2018-01-23 ENCOUNTER — Other Ambulatory Visit: Payer: Self-pay | Admitting: Endocrinology

## 2018-02-16 ENCOUNTER — Ambulatory Visit: Payer: BLUE CROSS/BLUE SHIELD | Admitting: Podiatry

## 2018-04-22 ENCOUNTER — Telehealth: Payer: Self-pay | Admitting: *Deleted

## 2018-04-22 ENCOUNTER — Encounter: Payer: Self-pay | Admitting: Neurology

## 2018-04-22 ENCOUNTER — Ambulatory Visit (INDEPENDENT_AMBULATORY_CARE_PROVIDER_SITE_OTHER): Payer: BLUE CROSS/BLUE SHIELD | Admitting: Neurology

## 2018-04-22 VITALS — BP 138/80 | HR 72 | Ht 67.0 in | Wt 201.0 lb

## 2018-04-22 DIAGNOSIS — G6181 Chronic inflammatory demyelinating polyneuritis: Secondary | ICD-10-CM | POA: Diagnosis not present

## 2018-04-22 MED ORDER — DULOXETINE HCL 60 MG PO CPEP: 60.0000 mg | ORAL_CAPSULE | Freq: Every day | ORAL | 4 refills | Status: AC

## 2018-04-22 MED ORDER — GABAPENTIN 300 MG PO CAPS: 300.0000 mg | ORAL_CAPSULE | Freq: Three times a day (TID) | ORAL | 11 refills | Status: DC

## 2018-04-22 NOTE — Telephone Encounter (Signed)
No showed follow up appointment. 

## 2018-04-22 NOTE — Progress Notes (Signed)
PATIENT: Haley Valdez DOB: [birth year]  Chief Complaint  Patient presents with  . Paresthesia/Gait abnormality    She has continued IVIG through [facility].  Overall, she feels it works well.  She does report a return of tingling in her outer left thigh.     HISTORICAL  Haley Valdez is a 56 year old female, seen in refer by her primary care doctor Haley Valdez.  for evaluation of transient ischemic attack, initial evaluation was on April 22, 2017.  I reviewed and summarized the referring note, she has past medical history of hypothyroidism, peripheral neuropathy, hyperlipidemia,  She works as a Copywriter, advertising for many years, around 1995, she began to experience neck pain, intermittent bilateral feet and hand paresthesia, began to seek neurological care, per patient, patient was diagnosed was carpal tunnel syndromes, peripheral neuropathy,  Then she began to develop unsteady gait,  began to fall since age 49,  She was also diagnosed with hepatitis C received treatment around 2008, around that time, she complains of excessive fatigue, generalized weakness,  In 2015, with her abnormal neck posturing, persistent neck pain, she received Botox injection in May 2015, responded very well, second injection in August 2015, has caused left neck pain, swollen, weakness, has to hold her head up with her hand  Since August 2015, she complains of frequent left-sided neck pain, radiating pain to left occipital, left parietal region, left side headaches,  She had extensive evaluations, I was able to review outside MRI cervical report October 2015, Left C3-4 facet abnormality is deep to the tender palpable  abnormality. The facet is overgrown due to degenerative change. In addition, there is bone marrow and adjacent soft tissue edema and enhancement suggesting active osteoarthritis. Underlying infection not excluded but considered less likely. Correlate with symptoms. Nevidence of  discitis or abscess. There is cervical spondylosis. No other acute abnormality.  No mass or adenopathy detected.  MRI brian in Oct 2018: Mild nonspecific supratentorial small vessel disease, no contrast enhancement,  Repeat MRI in Cervcial in June 2018.  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, with severe left foraminal stenosis, multilevel degenerative disc disease, she is now referred to neurosurgeon, pain management for epidural injection  She continued complaints of intermittent bilateral hand and feet paresthesia, gait abnormality, but today's neurological examination are fairly normal,  Laboratory evaluations in October 2018, INR 0.9 normal CBC,  Update July 22, 2017: She is alone at today's clinical visit, pressed speech, volunteer a lot of informations, long history of ADHD, taking Adderall 10 mg daily, also on polypharmacy treatment, chronic methadone treatment 10 mg 3 times a day, also taking Ativan 1 mg every 8 hours, Cymbalta 60 mg daily, Neurontin 300 mg 3 times a day  She continue complains of neck pain, radiating pain to left shoulder, drop things from her left hand,   I was able to review the EMG nerve conduction study dated April 09, 2017 by Dr. 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  She previously received the Botox injection for her neck pain, abnormal neck posturing in May again August 2015  by outside neurologist [name], [name], I was able to review injection note, 1. Bilateral Upper trapezius received 40 units  each side 2. Splenius Capiti received 20 units each side 3. Longissimus 20 units each side 4. Semispinalis Capitis 20 units side  She reported since her injection in August 2015, she had a knot at left neck from injection, been persistent, continue have significant neck pain, frequent headaches,  We have personally reviewed MRI of the brain with and without contrast in October 2018: No acute abnormality, scattered subcortical hyperintense T2 signal changes, largest is at left anterior temporal lobe, no contrast enhancement, most consistent with small vessel disease.  MRI of cervical spine multilevel degenerative changes, left facet arthritis at C3-4 with severe left foraminal stenosis, there is no significant canal stenosis.  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  There is also suggestion of temporal dispersion at bilateral tibial proximal stimulation sites.  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 on October 08, 2017, decreased TSH, there was suggestion of her thyroid supplement change, the patient did not make any changes yet,  She continue has mood swings, pressed speech, complains of bilateral feet paresthesia, gait abnormality, urinary urgency, she did have significant  hyperreflexia of bilateral patella but absent ankle reflexes, less dependent sensory changes, MRI of the brain and cervical findings would not explain her hyperreflexia, ordered MRI of thoracic spine  UPDATE November 26 2017: She had her first home IVIG infusion through [facility] on June 13, 14, a week after infusion, she reported doing well, however, about 3 weeks after the infusion, in early July 2019 she noticed a rash broke out at the bottom of her feet, arm, dry scaly skins, there is also raised erythematous dry scaly rash at bilateral leg, she reported similar reaction to interferon for hepatitis C treatment in the past, she was seen by her dermatologist Dr.Walter Elvera Lennox in recent few months, was given the diagnosis of eczema, given a prescription of dexamethasone cream.  Patient stated, [facility] home infusion agent is planning on switching from Octgam to different 10% IVIG privigen, she complains of excessive stress, depression, " she feel manner just want to stay in bed all the time"   IVIG infusion in June did help her some, she feel more energetic,  Less lower extremity spasticity, paresthesia,  MRI of thoracic spine on October 29, 2017 was normal  UPDATE Apr 22 2018: She continued her IVIG infusion, did notice less fatigue, lessening paresthesia and pain, she is going through a lot of stress, complains of worsening anxiety, she wished to continue IVIG treatment, but has to switch to a different provider in [facility] system for better coverage from her insurance company,  REVIEW OF SYSTEMS: Full 14 system review of systems performed and notable only for as above ALLERGIES: Allergies  Allergen Reactions  . Nitrofurantoin Itching  Redness in feet   . Wasp Venom Protein Other (See Comments)    cellutlitis    HOME MEDICATIONS: Current Outpatient Medications  Medication Sig Dispense Refill  . amphetamine-dextroamphetamine (ADDERALL) 10 MG tablet Take 10 mg by mouth daily.    .  DULoxetine (CYMBALTA) 60 MG capsule Take 60 mg by mouth daily.    Marland Kitchen gabapentin (NEURONTIN) 300 MG capsule Take 300 mg by mouth 3 (three) times daily.    Marland Kitchen levothyroxine (SYNTHROID, LEVOTHROID) 88 MCG tablet TAKE 1 TABLET(88 MCG) BY MOUTH DAILY 10 tablet 0  . liothyronine (CYTOMEL) 5 MCG tablet Take 2 tablets before breakfast and 1 tablet before dinner 270 tablet 0  . LORazepam (ATIVAN) 1 MG tablet Take 1 mg by mouth every 8 (eight) hours.    . methadone (DOLOPHINE) 10 MG tablet Take 10 mg by mouth every 8 (eight) hours.    . traZODone (DESYREL) 50 MG tablet Take 50 mg by mouth at bedtime.     No current facility-administered medications for this visit.     PAST MEDICAL HISTORY: Past Medical History:  Diagnosis Date  . ADHD   . Chronic neck pain   . Depression   . Fibromyalgia   . Hyperlipemia   . Neuropathy   . Raynaud's disease   . Thyroid disease     PAST SURGICAL HISTORY: Past Surgical History:  Procedure Laterality Date  . arm surgery Right    Fracture  . CESAREAN SECTION    . LEG SURGERY Right    Fracture  . TUBAL LIGATION      FAMILY HISTORY: Family History  Problem Relation Age of Onset  . Heart attack Mother   . Thyroid disease Mother   . Stroke Father   . Thyroid disease Sister     SOCIAL HISTORY:  Social History   Socioeconomic History  . Marital status: Married    Spouse name: Not on file  . Number of children: 2  . Years of education: 62  . Highest education level: Associate degree: occupational, Hotel manager, or vocational program  Occupational History  . Occupation: [employer]  . Financial resource strain: Not on file  . Food insecurity:    Worry: Not on file    Inability: Not on file  . Transportation needs:    Medical: Not on file    Non-medical: Not on file  Tobacco Use  . Smoking status: Current Every Day Smoker    Packs/day: 1.00    Types: Cigarettes  . Smokeless tobacco: Never Used  Substance and Sexual  Activity  . Alcohol use: Yes    Comment: occ  . Drug use: No  . Sexual activity: Not on file  Lifestyle  . Physical activity:    Days per week: Not on file    Minutes per session: Not on file  . Stress: Not on file  Relationships  . Social connections:    Talks on phone: Not on file    Gets together: Not on file    Attends religious service: Not on file    Active member of club or organization: Not on file    Attends meetings of clubs or organizations: Not on file    Relationship status: Not on file  . Intimate partner violence:    Fear of current or ex partner: Not on file    Emotionally abused: Not on file    Physically abused: Not on file    Forced sexual activity: Not on  file  Other Topics Concern  . Not on file  Social History Narrative   Lives at home with her boyfriend.   Right-handed.   1 cup caffeine per day.     PHYSICAL EXAM   Vitals:   04/22/18 1604  BP: 138/80  Pulse: 72  Weight: 201 lb (91.2 kg)  Height: '5\' 7"'  (1.702 m)    Not recorded      Body mass index is 31.48 kg/m.  PHYSICAL EXAMNIATION:  Gen: NAD, conversant, well nourised, obese, well groomed                     Cardiovascular: Regular rate rhythm, no peripheral edema, warm, nontender. Eyes: Conjunctivae clear without exudates or hemorrhage Neck: Supple, no carotid bruits. Pulmonary: Clear to auscultation bilaterally   NEUROLOGICAL EXAM:  MENTAL STATUS: Speech:    Speech is normal; fluent and spontaneous with normal comprehension.  Cognition:     Orientation to time, place and person     Normal recent and remote memory     Normal Attention span and concentration     Normal Language, naming, repeating,spontaneous speech     Fund of knowledge   CRANIAL NERVES: CN II: Visual fields are full to confrontation.  Pupils are round equal and briskly reactive to light. CN III, IV, VI: extraocular movement are normal. No ptosis. CN V: Facial sensation is intact to pinprick in all 3  divisions bilaterally. Corneal responses are intact.  CN VII: Face is symmetric with normal eye closure and smile. CN VIII: Hearing is normal to rubbing fingers CN IX, X: Palate elevates symmetrically. Phonation is normal. CN XI: Head turning and shoulder shrug are intact CN XII: Tongue is midline with normal movements and no atrophy.  MOTOR: She has mild bilateral toe extension, flexion weakness  REFLEXES: Reflexes are absent and symmetric at the biceps, triceps, 2/2 knees, and absent at bilateral ankle. Plantar responses are flexor.  SENSORY: Length dependent decreased light touch, pinprick and  vibratory sensation to ankle levels  COORDINATION: Rapid alternating movements and fine finger movements are intact. There is no dysmetria on finger-to-nose and heel-knee-shin.    GAIT/STANCE: Mild stiff, steady gait   Romberg is present.   DIAGNOSTIC DATA (LABS, IMAGING, TESTING) - I reviewed patient records, labs, notes, testing and imaging myself where available.   ASSESSMENT AND PLAN  Adaleah Forget is a 57 y.o. female   Bilateral feet paresthesia, gait abnormality  Electrodiagnostic study on August 07, 2017 showed evidence of CIDP, which is confirmed by spinal fluid testing showed elevated total protein of 110, WBC of 0  She received her first round of IVIG on June 13, 14 2019, 2 g/kg for IV infusion,  The atypical features is significant hyperreflexia of bilateral upper extremity and patella, there was no significant pathology on MRI of the brain, cervical, thoracic spine.  She complains of worsening eczema, bilateral lower extremity skin rash with recent IVIG in June 2019, I was able to review dermatologist Dr. Elvera Lennox evaluation in October 2018, erythematous scaly patches at the right mid inferior lateral plantar foot, left heel, diagnosis was dermatitis, has used antifungals without relief, was given topical steroid, 0.05% betamethasone ointment, KOH exam under high dry  magnification light microscope was negative.   She continued showing improvement with IVIG 1 g/kg (divided into 0.5 mg/kg x 2) every month  Refer her to [facility] neuromuscular specialist,  Abnormal thyroid function  Was seen by her endocrinologist Dr. Dwyane Dee, repeat TSH  was less than 0.01, she will continue to work with her endocrinologist and primary care  Marcial Pacas, M.D. Ph.D.  [facility] [address] [place], [place] [address] Ph: [phone] Fax: [phone]  CC: Haley Valdez., MD

## 2018-04-22 NOTE — Telephone Encounter (Signed)
Patient arrived at 3:36pm for her 3pm appt.  Per Dr. Terrace ArabiaYan, she will still see her but she will need to wait until the scheduled patient have completed their appts.

## 2018-08-23 ENCOUNTER — Telehealth: Payer: Self-pay

## 2018-08-23 NOTE — Telephone Encounter (Signed)
Received notice from [facility] that Octagam injections have been approved for this pt from 09/09/17-02/03/2019. Ref#: [id number]. 32 visits authorized. It does not appear that our office attempted a PA for octagam recently, this may just be an FYI for our office of continued coverage.

## 2018-10-07 ENCOUNTER — Other Ambulatory Visit: Payer: Self-pay | Admitting: Endocrinology

## 2018-10-20 ENCOUNTER — Telehealth: Payer: Self-pay

## 2018-10-20 NOTE — Telephone Encounter (Signed)
Error

## 2018-12-15 ENCOUNTER — Telehealth: Payer: Self-pay

## 2018-12-15 NOTE — Telephone Encounter (Signed)
Received fax from [employer] stating pt received Privigen 85 grams on 10/28/18 and 10/29/2018. Pt tolerated well and not side effects reported.

## 2019-05-12 ENCOUNTER — Telehealth: Payer: Self-pay

## 2019-05-12 NOTE — Telephone Encounter (Signed)
The patient has not been seen since 04/22/2018.  Per vo by Dr. Terrace Arabia, she needs to be evaluated in the office to determine if the continuation of IVIG is necessary.  I have scheduled her an appt on 06/01/2019 at 4pm.  I left this information on her voicemail.  I requested she arrive at 3:30pm for check-in.  She was provided with our number to call back if this appt does not work well for her.

## 2019-05-12 NOTE — Telephone Encounter (Signed)
Patient called to advise the PA for the medication for her CIDP was denied.   Please follow up.

## 2019-06-01 ENCOUNTER — Encounter: Payer: Self-pay | Admitting: Neurology

## 2019-06-01 ENCOUNTER — Other Ambulatory Visit: Payer: Self-pay

## 2019-06-01 ENCOUNTER — Ambulatory Visit: Payer: Medicare HMO | Admitting: Neurology

## 2019-06-01 VITALS — BP 155/94 | HR 73 | Temp 97.3°F | Ht 67.0 in | Wt 178.5 lb

## 2019-06-01 DIAGNOSIS — R202 Paresthesia of skin: Secondary | ICD-10-CM | POA: Diagnosis not present

## 2019-06-01 DIAGNOSIS — R269 Unspecified abnormalities of gait and mobility: Secondary | ICD-10-CM

## 2019-06-01 DIAGNOSIS — G6181 Chronic inflammatory demyelinating polyneuritis: Secondary | ICD-10-CM

## 2019-06-01 NOTE — Progress Notes (Signed)
PATIENT: Haley Valdez DOB: 1961/02/27  Chief Complaint  Patient presents with  . CIDP    Her last IVIG was 04/14/2019. Her insurance changed and she has now been approved for this year. Her next infusion is schedule the first week in February. Feels she has worsening bilateral leg spasms when there is too much time in between infusions.      HISTORICAL  Haley Valdez Haley Valdez is a 59 year old female, seen in refer by her primary care doctor [name].  for evaluation of transient ischemic attack, initial evaluation was on April 22, 2017.  I reviewed and summarized the referring note, she has past medical history of hypothyroidism, peripheral neuropathy, hyperlipidemia,  She works as a Copywriter, advertising for many years, around 1995, she began to experience neck pain, intermittent bilateral feet and hand paresthesia, began to seek neurological care, per patient, patient was diagnosed was carpal tunnel syndromes, peripheral neuropathy,  Then she began to develop unsteady gait,  began to fall since age 23,  She was also diagnosed with hepatitis C received treatment around 2008, around that time, she complains of excessive fatigue, generalized weakness,  In 2015, with her abnormal neck posturing, persistent neck pain, she received Botox injection in May 2015, responded very well, second injection in August 2015, has caused left neck pain, swollen, weakness, has to hold her head up with her hand  Since August 2015, she complains of frequent left-sided neck pain, radiating pain to left occipital, left parietal region, left side headaches,  She had extensive evaluations, I was able to review outside MRI cervical report October 2015, Left C3-4 facet abnormality is deep to the tender palpable  abnormality. The facet is overgrown due to degenerative change. In addition, there is bone marrow and adjacent soft tissue edema and enhancement suggesting active osteoarthritis. Underlying  infection not excluded but considered less likely. Correlate with symptoms. Nevidence of discitis or abscess. There is cervical spondylosis. No other acute abnormality.  No mass or adenopathy detected.  MRI brian in Oct 2018: Mild nonspecific supratentorial small vessel disease, no contrast enhancement,  Repeat MRI in Cervcial in June 2018.  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, with severe left foraminal stenosis, multilevel degenerative disc disease, she is now referred to neurosurgeon, pain management for epidural injection  She continued complaints of intermittent bilateral hand and feet paresthesia, gait abnormality, but today's neurological examination are fairly normal,  Laboratory evaluations in October 2018, INR 0.9 normal CBC,  Update July 22, 2017: She is alone at today's clinical visit, pressed speech, volunteer a lot of informations, long history of ADHD, taking Adderall 10 mg daily, also on polypharmacy treatment, chronic methadone treatment 10 mg 3 times a day, also taking Ativan 1 mg every 8 hours, Cymbalta 60 mg daily, Neurontin 300 mg 3 times a day  She continue complains of neck pain, radiating pain to left shoulder, drop things from her left hand,   I was able to review the EMG nerve conduction study dated April 09, 2017 by Dr. 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  She  previously received the Botox injection for her neck pain, abnormal neck posturing in May again August 2015 by outside neurologist [name], [name],  I was able to review injection note, 1. Bilateral Upper trapezius received 40 units each side 2. Splenius Capiti received 20 units each side 3. Longissimus 20 units each side 4. Semispinalis Capitis 20 units side  She reported since her injection in August 2015, she had a knot at left neck from injection, been persistent, continue have significant neck pain, frequent headaches,  We have personally reviewed MRI of the brain with and without contrast in October 2018: No acute abnormality, scattered subcortical hyperintense T2 signal changes, largest is at left anterior temporal lobe, no contrast enhancement, most consistent with small vessel disease.  MRI of cervical spine multilevel degenerative changes, left facet arthritis at C3-4 with severe left foraminal stenosis, there is no significant canal stenosis.  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  There is also suggestion of temporal dispersion at bilateral tibial proximal stimulation sites.  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 on October 08, 2017, decreased TSH, there was suggestion of her thyroid supplement change, the patient did not make any changes yet,  She continue has mood swings, pressed speech, complains of bilateral feet  paresthesia, gait abnormality, urinary urgency, she did have significant hyperreflexia of bilateral patella but absent ankle reflexes, less dependent sensory changes, MRI of the brain and cervical findings would not explain her hyperreflexia, ordered MRI of thoracic spine  UPDATE November 26 2017: She had her first home IVIG infusion through [facility] on June 13, 14, a week after infusion, she reported doing well, however, about 3 weeks after the infusion, in early July 2019 she noticed a rash broke out at the bottom of her feet, arm, dry scaly skins, there is also raised erythematous dry scaly rash at bilateral leg, she reported similar reaction to interferon for hepatitis C treatment in the past, she was seen by her dermatologist Dr.Walter Elvera Lennox in recent few months, was given the diagnosis of eczema, given a prescription of dexamethasone cream.  Patient stated, [facility] agent is planning on switching from Octgam to different 10% IVIG privigen, she complains of excessive stress, depression, " she feel manner just want to stay in bed all the time"   IVIG infusion in June did help her some, she feel more energetic,  Less lower extremity spasticity, paresthesia,  MRI of thoracic spine on October 29, 2017 was normal  UPDATE Apr 22 2018: She continued her IVIG infusion, did notice less fatigue, lessening paresthesia and pain, she is going through a lot of stress, complains of worsening anxiety, she wished to continue IVIG treatment, but has to switch to a different provider in [facility] system for better coverage from her insurance company,  Update June 01, 2019: She has lost follow-up  since last visit in December 2019 due to health insurance, she is on thyroid supplement now, she had a history of chronic diffuse body achy pain, fibromyalgia, was under pain management, also see psychiatrist, taking Cymbalta 60 mg daily, gabapentin 300 mg 3 times a day, methadone 10 mg every 8 hours,  Adderall 10 mg daily  She has been receiving her IVIG through home health on a monthly basis, she continues to report improvement, continue has mild gait abnormality, especially when closing her eyes  She was diagnosed with hand and feet psoriasis,  Laboratory evaluation from [facility] system in September 2020, normal hemoglobin of 12.8, elevated triglyceride 509, total cholesterol 205, normal CMP, creatinine of 0.96, UDS was positive for methadone, amphetamine,   REVIEW OF SYSTEMS: Full 14 system review of systems performed and notable only for as above ALLERGIES: Allergies  Allergen Reactions  . Nitrofurantoin Itching    Redness in feet   . Wasp Venom Protein Other (See Comments)    cellutlitis    HOME MEDICATIONS: Current Outpatient Medications  Medication Sig Dispense Refill  . amphetamine-dextroamphetamine (ADDERALL) 10 MG tablet Take 10 mg by mouth daily.    Marland Kitchen CALCIUM-MAGNESIUM-ZINC PO Take 1 tablet by mouth daily.    . DULoxetine (CYMBALTA) 60 MG capsule Take 1 capsule (60 mg total) by mouth daily. 90 capsule 4  . gabapentin (NEURONTIN) 300 MG capsule Take 1 capsule (300 mg total) by mouth 3 (three) times daily. 90 capsule 11  . levothyroxine (SYNTHROID, LEVOTHROID) 88 MCG tablet TAKE 1 TABLET(88 MCG) BY MOUTH DAILY 10 tablet 0  . liothyronine (CYTOMEL) 5 MCG tablet Take 2 tablets before breakfast and 1 tablet before dinner 270 tablet 0  . LORazepam (ATIVAN) 1 MG tablet Take 1 mg by mouth every 8 (eight) hours.    . methadone (DOLOPHINE) 10 MG tablet Take 10 mg by mouth every 8 (eight) hours.    . traZODone (DESYREL) 50 MG tablet Take 50 mg by mouth at bedtime.     No current facility-administered medications for this visit.    PAST MEDICAL HISTORY: Past Medical History:  Diagnosis Date  . ADHD   . Chronic neck pain   . Depression   . Fibromyalgia   . Hyperlipemia   . Neuropathy   . Raynaud's disease   . Thyroid disease     PAST SURGICAL HISTORY: Past Surgical  History:  Procedure Laterality Date  . arm surgery Right    Fracture  . CESAREAN SECTION    . LEG SURGERY Right    Fracture  . TUBAL LIGATION      FAMILY HISTORY: Family History  Problem Relation Age of Onset  . Heart attack Mother   . Thyroid disease Mother   . Stroke Father   . Thyroid disease Sister     SOCIAL HISTORY:  Social History   Socioeconomic History  . Marital status: Married    Spouse name: Not on file  . Number of children: 2  . Years of education: 57  . Highest education level: Associate degree: occupational, Hotel manager, or vocational program  Occupational History  . Occupation: Designer, multimedia  Tobacco Use  . Smoking status: Current Every Day Smoker    Packs/day: 1.00    Types: Cigarettes  . Smokeless tobacco: Never Used  Substance and Sexual Activity  . Alcohol use: Yes    Comment: occ  . Drug use: No  . Sexual activity: Not on file  Other Topics Concern  . Not on file  Social History Narrative   Lives at home with her boyfriend.   Right-handed.   1 cup caffeine per day.   Social Determinants of Health   Financial Resource Strain:   . Difficulty of Paying Living Expenses: Not on file  Food Insecurity:   . Worried About Charity fundraiser in the Last Year: Not on file  . Ran Out of Food in the Last Year: Not on file  Transportation Needs:   . Lack of Transportation (Medical): Not on file  . Lack of Transportation (Non-Medical): Not on file  Physical Activity:   . Days of Exercise per Week: Not on file  . Minutes of Exercise per Session: Not on file  Stress:   . Feeling of Stress : Not on file  Social Connections:   . Frequency of Communication with Friends and Family: Not on file  . Frequency of Social Gatherings with Friends and Family: Not on file  . Attends Religious Services: Not on file  . Active Member of Clubs or Organizations: Not on file  . Attends Archivist Meetings: Not on file  . Marital Status: Not on  file  Intimate Partner Violence:   . Fear of Current or Ex-Partner: Not on file  . Emotionally Abused: Not on file  . Physically Abused: Not on file  . Sexually Abused: Not on file     PHYSICAL EXAM   Vitals:   06/01/19 1602  BP: (!) 155/94  Pulse: 73  Temp: (!) 97.3 F (36.3 C)  Weight: 178 lb 8 oz (81 kg)  Height: '5\' 7"'  (1.702 m)    Not recorded      Body mass index is 27.96 kg/m.  PHYSICAL EXAMNIATION:  Gen: NAD, conversant, well nourised, obese, well groomed                     Cardiovascular: Regular rate rhythm, no peripheral edema, warm, nontender. Eyes: Conjunctivae clear without exudates or hemorrhage Neck: Supple, no carotid bruits. Pulmonary: Clear to auscultation bilaterally   NEUROLOGICAL EXAM:  MENTAL STATUS: Speech:    Speech is normal; fluent and spontaneous with normal comprehension.  Cognition:     Orientation to time, place and person     Normal recent and remote memory     Normal Attention span and concentration     Normal Language, naming, repeating,spontaneous speech     Fund of knowledge   CRANIAL NERVES: CN II: Visual fields are full to confrontation.  Pupils are round equal and briskly reactive to light. CN III, IV, VI: extraocular movement are normal. No ptosis. CN V: Facial sensation is intact to pinprick in all 3 divisions bilaterally. Corneal responses are intact.  CN VII: Face is symmetric with normal eye closure and smile. CN VIII: Hearing is normal to rubbing fingers CN IX, X: Palate elevates symmetrically. Phonation is normal. CN XI: Head turning and shoulder shrug are intact CN XII: Tongue is midline with normal movements and no atrophy.  MOTOR: She has mild bilateral toe extension, flexion weakness  REFLEXES: Reflexes are absent and symmetric at the biceps, triceps, 2/2 knees, and absent at bilateral ankle. Plantar responses are flexor.  SENSORY: Length dependent decreased light touch, pinprick and  vibratory sensation  to ankle levels  COORDINATION: Rapid alternating movements and fine finger movements are intact. There is no dysmetria on finger-to-nose and heel-knee-shin.    GAIT/STANCE: Need to push up to get up from seated position, steady, able to stand up  on tiptoe, heels, mild positive Romberg signs.   DIAGNOSTIC DATA (LABS, IMAGING, TESTING) - I reviewed patient records, labs, notes, testing and imaging myself where available.   ASSESSMENT AND PLAN  Rosita Guzzetta is a 59 y.o. female   Bilateral feet paresthesia, gait abnormality  Electrodiagnostic study on August 07, 2017 showed evidence of CIDP, which is confirmed by spinal fluid testing showed elevated total protein of 110, WBC of 0  She received her first round of IVIG on June 13, 14 2019, 2 g/kg for IV infusion, maintenance dose 1 g/kg through home infusion since,  The atypical features is significant hyperreflexia of bilateral upper extremity and patella, there was no significant pathology on MRI of the brain, cervical, thoracic spine.  Repeat EMG nerve conduction study   Hands and feet rash  Per patient, she was diagnosed with hand and feet psoriasis, is treated with cream,   Diffuse body achy pain  Recommended her continue medication management by pain management   Marcial Pacas, M.D. Ph.D.  [facility] [address], [place], [place] [address] Ph: [phone] Fax: [phone]  CC: [name]., MD

## 2019-06-20 ENCOUNTER — Telehealth: Payer: Self-pay

## 2019-06-20 NOTE — Telephone Encounter (Signed)
Benadryl orders fax to [facility] at  [phone] twice and confirmed.

## 2019-06-28 ENCOUNTER — Ambulatory Visit: Payer: BLUE CROSS/BLUE SHIELD | Admitting: Neurology

## 2019-07-13 ENCOUNTER — Ambulatory Visit (INDEPENDENT_AMBULATORY_CARE_PROVIDER_SITE_OTHER): Payer: Medicare HMO | Admitting: Neurology

## 2019-07-13 ENCOUNTER — Ambulatory Visit: Payer: Medicare HMO | Admitting: Neurology

## 2019-07-13 ENCOUNTER — Other Ambulatory Visit: Payer: Self-pay

## 2019-07-13 DIAGNOSIS — G6181 Chronic inflammatory demyelinating polyneuritis: Secondary | ICD-10-CM

## 2019-07-13 DIAGNOSIS — M5440 Lumbago with sciatica, unspecified side: Secondary | ICD-10-CM

## 2019-07-13 NOTE — Procedures (Signed)
Full Name: Haley Valdez Gender: Female MRN #: [id number] Date of Birth: 10-Jun-1960    Visit Date: 07/13/2019 09:03 Age: 59 Years Examining Physician: Levert Feinstein, MD  Referring Physician: Levert Feinstein, MD History:   Patient was diagnosed with chronic inflammatory demyelinating polyradiculoneuropathy in the past, has been treated with IVIG, showed mild to moderate improvement, but continue complains of bilateral upper and lower extremity paresthesia, gait abnormality  Summary of the tests:  Nerve conduction study:  Left superficial peroneal sensory responses were absent.  Right superficial peroneal, bilateral sural sensory responses showed normal peak latency, with mildly decreased snap amplitude.  Left median sensory responses showed significantly prolonged peak latency, with moderately decreased snap amplitude.  Left ulnar, sensory responses showed mildly prolonged peak latency, within normal range snap amplitude.  Left tibial motor responses were normal.  Right tibial motor responses showed mildly prolonged distal latency, moderately decreased CMAP amplitude, and mildly slow conduction velocity.  Right peroneal to EDB motor responses were absent.  Left peroneal to EDB motor responses showed significantly prolonged distal latency, mildly decreased CMAP amplitude, also evidence of temporal dispersion, significantly decreased conduction velocity.  Left median motor responses showed significantly prolonged distal latency, with significantly decreased CMAP amplitude.  Left ulnar motor responses showed mildly prolonged distal latency, with normal CMAP amplitude, conduction velocity.  Bilateral tibial, and ulnar F-wave latency was significantly prolonged, but compared to previous study in April 2019, there was mild to moderate improvement  Electromyography: Selected needle examinations of bilateral lower extremity muscles, bilateral lumbosacral paraspinal muscles; left upper extremity  muscles, left cervical paraspinal muscles were performed.  There was evidence of neuropathic changes involving bilateral lower extremity muscles, bilateral tibialis anterior, peroneal longus, tibialis posterior, medial gastrocnemius.  But there was no evidence of spontaneous activity.   Conclusion: This is an abnormal study.  There is evidence of chronic demyelinating polyradiculoneuropathy.  There is no evidence of axonal loss.  Compared to previous study in April 2019, there was mild to moderate improvement, as evident by increased multiple motor CMAP amplitude, improved F-wave latency, and appearance of bilateral lower extremity sensory responses.    ------------------------------- Levert Feinstein, M.D. PhD  [facility] [address] [place], [place] [address] Tel: [phone] Fax: [phone]         [facility]    Nerve / Sites Muscle Latency Ref. Amplitude Ref. Rel Amp Segments Distance Velocity Ref. Area    ms ms mV mV %  cm m/s m/s mVms  L Median - APB     Wrist APB 7.7 ?4.4 0.9 ?4.0 100 Wrist - APB 7   3.7     Upper arm APB 12.2  0.9  94.4 Upper arm - Wrist 22 49 ?49 4.6  L Ulnar - ADM     Wrist ADM 4.0 ?3.3 7.8 ?6.0 100 Wrist - ADM 7   36.8     B.Elbow ADM 8.1  6.1  78 B.Elbow - Wrist 20 49 ?49 31.8     A.Elbow ADM 10.2  5.2  85.6 A.Elbow - B.Elbow 10 49 ?49 28.4         A.Elbow - Wrist      R Peroneal - EDB     Ankle EDB NR ?6.5 NR ?2.0 NR Ankle - EDB 9   NR     Fib head EDB NR  NR  NR Fib head - Ankle 26 NR ?44 NR     Pop fossa EDB NR  NR  NR Pop fossa - Fib head 10 NR ?44 NR         Pop fossa - Ankle      L Peroneal - EDB     Ankle EDB 8.2 ?6.5 1.6 ?2.0 100 Ankle - EDB 9   5.6     Fib head EDB 16.4  1.4  86.8 Fib head - Ankle 27 33 ?44 7.1     Pop fossa EDB 19.9  1.1  76.1 Pop fossa - Fib head 10 28 ?44 4.7         Pop fossa - Ankle      R Tibial - AH     Ankle AH 6.2 ?5.8 2.3 ?4.0 100 Ankle - AH 9   8.1     Pop fossa AH 16.8  1.9  81.5 Pop fossa - Ankle  37 35 ?41 8.5  L Tibial - AH     Ankle AH 5.5 ?5.8 4.4 ?4.0 100 Ankle - AH 9   16.4     Pop fossa AH 16.9  4.5  103 Pop fossa - Ankle 36 32 ?41 11.2                   SNC    Nerve / Sites Rec. Site Peak Lat Ref.  Amp Ref. Segments Distance    ms ms V V  cm  L Radial - Anatomical snuff box (Forearm)     Forearm Wrist 3.1 ?2.9 17 ?15 Forearm - Wrist 10  R Sural - Ankle (Calf)     Calf Ankle 4.0 ?4.4 3 ?6 Calf - Ankle 14  L Sural - Ankle (Calf)     Calf Ankle 4.0 ?4.4 9 ?6 Calf - Ankle 14  R Superficial peroneal - Ankle     Lat leg Ankle 4.3 ?4.4 4 ?6 Lat leg - Ankle 14  L Superficial peroneal - Ankle     Lat leg Ankle NR ?4.4 NR ?6 Lat leg - Ankle 14  L Median - Orthodromic (Dig II, Mid palm)     Dig II Wrist 4.9 ?3.4 5 ?10 Dig II - Wrist 13  L Ulnar - Orthodromic, (Dig V, Mid palm)     Dig V Wrist 3.6 ?3.1 5 ?5 Dig V - Wrist 19                   F  Wave    Nerve F Lat Ref.   ms ms  R Tibial - AH 69.2 ?56.0  L Tibial - AH 70.2 ?56.0  L Ulnar - ADM 34.0 ?32.0           EMG Summary Table    Spontaneous MUAP Recruitment  Muscle IA Fib PSW Fasc Other Amp Dur. Poly Pattern  L. Tibialis anterior Increased None None None _______ Normal Normal Normal Reduced  L. Tibialis posterior Normal None None None _______ Normal Normal Normal Reduced  L. Gastrocnemius (Medial head) Normal None None None _______ Normal Normal Normal Reduced  L. Peroneus longus Normal None None None _______ Normal Normal Normal Reduced  L. Abductor hallucis Normal None None None _______ Increased Increased 1+ Reduced  L. Vastus lateralis Normal None None None _______ Normal Normal Normal Normal  L. Lumbar paraspinals (mid) Normal None None None _______ Normal Normal Normal Normal  L. Lumbar paraspinals (low) Normal None None None _______ Normal Normal Normal Normal  R. Tibialis anterior Increased None None None _______ Normal Normal Normal Reduced  R. Tibialis posterior  Increased None None None _______ Normal  Normal Normal Reduced  R. Peroneus longus Normal None None None _______ Increased Increased 1+ Reduced  R. Vastus lateralis Normal None None None _______ Normal Normal Normal Reduced  R. Lumbar paraspinals (low) Normal None None None _______ Normal Normal Normal Normal  L. First dorsal interosseous Normal None None None _______ Normal Normal Normal Normal  L. Pronator teres Normal None None None _______ Normal Normal Normal Normal  L. Brachioradialis Normal None None None _______ Normal Normal Normal Normal  L. Biceps brachii Normal None None None _______ Normal Normal Normal Normal  L. Deltoid Normal None None None _______ Normal Normal Normal Normal  L. Triceps brachii Normal None None None _______ Normal Normal Normal Normal  L. Extensor digitorum communis Normal None None None _______ Normal Normal Normal Normal  L. Cervical paraspinals Normal None None None _______ Normal Normal Normal Normal

## 2019-07-13 NOTE — Progress Notes (Signed)
PATIENT: Haley Valdez DOB: [birth year]  No chief complaint on file.    HISTORICAL  Haley Valdez Dylann Layne is a 59 year old female, seen in refer by her primary care doctor [name].  for evaluation of transient ischemic attack, initial evaluation was on April 22, 2017.  I reviewed and summarized the referring note, she has past medical history of hypothyroidism, peripheral neuropathy, hyperlipidemia,  She works as a Copywriter, advertising for many years, around 1995, she began to experience neck pain, intermittent bilateral feet and hand paresthesia, began to seek neurological care, per patient, patient was diagnosed was carpal tunnel syndromes, peripheral neuropathy,  Then she began to develop unsteady gait,  began to fall since age 50,  She was also diagnosed with hepatitis C received treatment around 2008, around that time, she complains of excessive fatigue, generalized weakness,  In 2015, with her abnormal neck posturing, persistent neck pain, she received Botox injection in May 2015, responded very well, second injection in August 2015, has caused left neck pain, swollen, weakness, has to hold her head up with her hand  Since August 2015, she complains of frequent left-sided neck pain, radiating pain to left occipital, left parietal region, left side headaches,  She had extensive evaluations, I was able to review outside MRI cervical report October 2015, Left C3-4 facet abnormality is deep to the tender palpable  abnormality. The facet is overgrown due to degenerative change. In addition, there is bone marrow and adjacent soft tissue edema and enhancement suggesting active osteoarthritis. Underlying infection not excluded but considered less likely. Correlate with symptoms. Nevidence of discitis or abscess. There is cervical spondylosis. No other acute abnormality.  No mass or adenopathy detected.  MRI brian in Oct 2018: Mild nonspecific supratentorial small vessel disease,  no contrast enhancement,  Repeat MRI in Cervcial in June 2018.  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, with severe left foraminal stenosis, multilevel degenerative disc disease, she is now referred to neurosurgeon, pain management for epidural injection  She continued complaints of intermittent bilateral hand and feet paresthesia, gait abnormality, but today's neurological examination are fairly normal,  Laboratory evaluations in October 2018, INR 0.9 normal CBC,  Update July 22, 2017: She is alone at today's clinical visit, pressed speech, volunteer a lot of informations, long history of ADHD, taking Adderall 10 mg daily, also on polypharmacy treatment, chronic methadone treatment 10 mg 3 times a day, also taking Ativan 1 mg every 8 hours, Cymbalta 60 mg daily, Neurontin 300 mg 3 times a day  She continue complains of neck pain, radiating pain to left shoulder, drop things from her left hand,   I was able to review the EMG nerve conduction study dated April 09, 2017 by Dr. 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  She previously received the Botox injection for her neck pain, abnormal neck posturing in May again August 2015 by outside neurologist [name], [name], I was able to review injection note, 1. Bilateral Upper trapezius received 40 units each side 2. Splenius Capiti received 20 units each side 3. Longissimus 20 units each  side 4. Semispinalis Capitis 20 units side  She reported since her injection in August 2015, she had a  knot at left neck from injection, been persistent, continue have significant neck pain, frequent headaches,  We have personally reviewed MRI of the brain with and without contrast in October 2018: No acute abnormality, scattered subcortical hyperintense T2 signal changes, largest is at left anterior temporal lobe, no contrast enhancement, most consistent with small vessel disease.  MRI of cervical spine multilevel degenerative changes, left facet arthritis at C3-4 with severe left foraminal stenosis, there is no significant canal stenosis.  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  There is also suggestion of temporal dispersion at bilateral tibial proximal stimulation sites.  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 on October 08, 2017, decreased TSH, there was suggestion of her thyroid supplement change, the patient did not make any changes yet,  She continue has mood swings, pressed speech, complains of bilateral feet paresthesia, gait abnormality, urinary urgency, she did have significant hyperreflexia of bilateral patella but absent ankle reflexes, less dependent sensory changes, MRI of the brain and cervical findings would not explain her hyperreflexia, ordered MRI of thoracic  spine  UPDATE November 26 2017: She had her first home IVIG infusion through [facility] on June 13, 14, a week after infusion, she reported doing well, however, about 3 weeks after the infusion, in early July 2019 she noticed a rash broke out at the bottom of her feet, arm, dry scaly skins, there is also raised erythematous dry scaly rash at bilateral leg, she reported similar reaction to interferon for hepatitis C treatment in the past, she was seen by her dermatologist Dr.Walter Elvera Lennox in recent few months, was given the diagnosis of eczema, given a prescription of dexamethasone cream.  Patient stated, [facility] home infusion agent is planning on switching from Octgam to different 10% IVIG privigen, she complains of excessive stress, depression, " she feel manner just want to stay in bed all the time"   IVIG infusion in June did help her some, she feel more energetic,  Less lower extremity spasticity, paresthesia,  MRI of thoracic spine on October 29, 2017 was normal  UPDATE Apr 22 2018: She continued her IVIG infusion, did notice less fatigue, lessening paresthesia and pain, she is going through a lot of stress, complains of worsening anxiety, she wished to continue IVIG treatment, but has to switch to a different provider in [facility] system for better coverage from her insurance company,  Update June 01, 2019: She has lost follow-up since last visit in December 2019 due to health insurance, she is on thyroid supplement now, she had a history of chronic diffuse body achy pain, fibromyalgia, was under pain management, also see psychiatrist, taking Cymbalta 60 mg daily, gabapentin 300 mg 3 times a day, methadone 10 mg every 8 hours,  Adderall 10 mg daily  She has been receiving her IVIG through home health on a monthly basis, she continues to report improvement, continue has mild gait abnormality, especially when closing her eyes  She was diagnosed with hand and feet psoriasis,  Laboratory  evaluation from [facility] in September 2020, normal hemoglobin of 12.8, elevated triglyceride 509, total cholesterol 205, normal CMP, creatinine of 0.96, UDS was positive for methadone, amphetamine,  UPDATE July 13 2019: Patient has been receiving IVIG treatment on a monthly basis, she is doing very well, continue have mild improvement, but continue complains of bilateral upper and lower extremity paresthesia, sometimes achy pain  She had a history of chronic low back pain, worsening since September 2020, now complains of frequent radiating pain to bilateral lower extremity, difficult to sit still, or standing for too long, also has urinary urgency  She return for electrodiagnostic study today, which continues show evidence of demyelinating polyradiculoneuropathy, but mild improvement compared to previous EMG study in April 2019   REVIEW OF SYSTEMS: Full 14 system review of systems performed and notable only for as above ALLERGIES: Allergies  Allergen Reactions  . Nitrofurantoin Itching    Redness in feet   . Wasp Venom Protein Other (See Comments)    cellutlitis    HOME MEDICATIONS: Current Outpatient Medications  Medication Sig Dispense Refill  . amphetamine-dextroamphetamine (ADDERALL) 10 MG tablet Take 10 mg by mouth daily.    Marland Kitchen CALCIUM-MAGNESIUM-ZINC PO Take 1 tablet by mouth daily.    . DULoxetine (CYMBALTA) 60 MG capsule Take 1 capsule (60 mg total) by mouth daily. 90 capsule 4  . gabapentin (NEURONTIN) 300 MG capsule Take 1 capsule (300 mg total) by mouth 3 (three) times daily. 90 capsule 11  . levothyroxine (SYNTHROID, LEVOTHROID) 88 MCG tablet TAKE 1 TABLET(88 MCG) BY MOUTH DAILY 10 tablet 0  . liothyronine (CYTOMEL) 5 MCG tablet Take 2 tablets before breakfast and 1 tablet before dinner 270 tablet 0  . LORazepam (ATIVAN) 1 MG tablet Take 1 mg by mouth every 8 (eight) hours.    . methadone (DOLOPHINE) 10 MG tablet Take 10 mg by mouth every 8 (eight) hours.    .  traZODone (DESYREL) 50 MG tablet Take 50 mg by mouth at bedtime.     No current facility-administered medications for this visit.    PAST MEDICAL HISTORY: Past Medical History:  Diagnosis Date  . ADHD   . Chronic neck pain   . Depression   . Fibromyalgia   . Hyperlipemia   . Neuropathy   . Raynaud's disease   . Thyroid disease     PAST SURGICAL HISTORY: Past Surgical History:  Procedure Laterality Date  . arm surgery Right    Fracture  . CESAREAN SECTION    . LEG SURGERY Right    Fracture  . TUBAL LIGATION      FAMILY HISTORY: Family History  Problem Relation Age of Onset  . Heart attack Mother   . Thyroid disease Mother   . Stroke Father   . Thyroid disease Sister     SOCIAL HISTORY:  Social History   Socioeconomic History  . Marital status: Married    Spouse name: Not on file  . Number of children: 2  . Years of education: 24  . Highest education level: Associate degree: occupational, Hotel manager, or vocational program  Occupational History  . Occupation: Designer, multimedia  Tobacco Use  . Smoking status: Current Every Day Smoker    Packs/day: 1.00  Types: Cigarettes  . Smokeless tobacco: Never Used  Substance and Sexual Activity  . Alcohol use: Yes    Comment: occ  . Drug use: No  . Sexual activity: Not on file  Other Topics Concern  . Not on file  Social History Narrative   Lives at home with her boyfriend.   Right-handed.   1 cup caffeine per day.   Social Determinants of Health   Financial Resource Strain:   . Difficulty of Paying Living Expenses: Not on file  Food Insecurity:   . Worried About Charity fundraiser in the Last Year: Not on file  . Ran Out of Food in the Last Year: Not on file  Transportation Needs:   . Lack of Transportation (Medical): Not on file  . Lack of Transportation (Non-Medical): Not on file  Physical Activity:   . Days of Exercise per Week: Not on file  . Minutes of Exercise per Session: Not on file   Stress:   . Feeling of Stress : Not on file  Social Connections:   . Frequency of Communication with Friends and Family: Not on file  . Frequency of Social Gatherings with Friends and Family: Not on file  . Attends Religious Services: Not on file  . Active Member of Clubs or Organizations: Not on file  . Attends Archivist Meetings: Not on file  . Marital Status: Not on file  Intimate Partner Violence:   . Fear of Current or Ex-Partner: Not on file  . Emotionally Abused: Not on file  . Physically Abused: Not on file  . Sexually Abused: Not on file     PHYSICAL EXAM   There were no vitals filed for this visit.  Not recorded      There is no height or weight on file to calculate BMI.  PHYSICAL EXAMNIATION:  NEUROLOGICAL EXAM:  MENTAL STATUS: Speech:    Speech is normal; fluent and spontaneous with normal comprehension.  Cognition:     Orientation to time, place and person     Normal recent and remote memory     Normal Attention span and concentration     Normal Language, naming, repeating,spontaneous speech     Fund of knowledge   CRANIAL NERVES: CN II: Visual fields are full to confrontation.  Pupils are round equal and briskly reactive to light. CN III, IV, VI: extraocular movement are normal. No ptosis. CN V: Facial sensation is intact to pinprick in all 3 divisions bilaterally. Corneal responses are intact.  CN VII: Face is symmetric with normal eye closure and smile. CN VIII: Hearing is normal to rubbing fingers CN IX, X: Palate elevates symmetrically. Phonation is normal. CN XI: Head turning and shoulder shrug are intact CN XII: Tongue is midline with normal movements and no atrophy.  MOTOR: She has mild bilateral toe extension, flexion weakness  REFLEXES: Reflexes are absent and symmetric at the biceps, triceps, 2/2 knees, and absent at bilateral ankle. Plantar responses are flexor.  SENSORY: Length dependent decreased light touch, pinprick  and  vibratory sensation to ankle levels  COORDINATION: Rapid alternating movements and fine finger movements are intact. There is no dysmetria on finger-to-nose and heel-knee-shin.    GAIT/STANCE: Need to push up to get up from seated position, steady, difficulty standing up on tiptoe, she did better with heels, mild positive Romberg signs.   DIAGNOSTIC DATA (LABS, IMAGING, TESTING) - I reviewed patient records, labs, notes, testing and imaging myself where available.   ASSESSMENT  AND PLAN  Denetria Luevanos is a 59 y.o. female   Chronic demyelinating polyradiculoneuropathy.  Repeat EMG nerve conduction study on July 13, 2019, continue showed features of demyelinating neuropathy, evident by prolonged distal latency, slow conduction velocity.  Prolonged F-wave latency,  Spinal fluid testing in 2019 showed total protein of 110, cytoalbumin dissociation, WBC of 0.  She received her first round of IVIG on June 13, 14 2019, 2 g/kg for IV infusion, maintenance dose 1 g/kg through home infusion since, reported mild to moderate improvement,  The atypical features is significant hyperreflexia of bilateral upper extremity and patella, there was no significant pathology on previous MRI of the brain, cervical, thoracic spine.  Worsening low back pain, radiating pain to bilateral lower extremity,  MRI of lumbar spine for potential lumbar radiculopathy  Hands and feet rash  Per patient, she was diagnosed with hand and feet psoriasis, is treated with cream,  Diffuse body achy pain  Is under pain management, currently taking Cymbalta 60 mg daily, gabapentin 300 mg 3 times a day, also on methadone 10 mg every 8 hours, trazodone, Ativan, Adderall.  Marcial Pacas, M.D. Ph.D.  [facility] [address] [place], [place] [address] Ph: [phone] Fax: [phone]  CC: [name]., MD

## 2019-07-21 ENCOUNTER — Telehealth: Payer: Self-pay | Admitting: Neurology

## 2019-07-21 DIAGNOSIS — M545 Low back pain, unspecified: Secondary | ICD-10-CM

## 2019-07-21 NOTE — Telephone Encounter (Signed)
Pt called stating she is having extreme butt pain/spasms . Pt called to check on getting her MRI scheduled

## 2019-07-21 NOTE — Telephone Encounter (Signed)
Dr. Terrace Arabia is order MRI lumbar w/o. Order placed. Pt aware to expect a call for scheduling once it is approved by insurance.

## 2019-07-27 ENCOUNTER — Telehealth: Payer: Self-pay | Admitting: Neurology

## 2019-07-27 NOTE — Telephone Encounter (Signed)
Haley Valdez Berkley Harvey: [phone] (exp. 07/27/19 to 08/26/19) order sent to GI. They will reach out to the patient to schedule.

## 2019-08-02 ENCOUNTER — Inpatient Hospital Stay: Admission: RE | Admit: 2019-08-02 | Payer: BLUE CROSS/BLUE SHIELD | Source: Ambulatory Visit

## 2019-09-08 NOTE — Telephone Encounter (Signed)
Good from 09/10/19 to 10/10/19) patient is scheduled at GI for 09/10/19. 

## 2019-09-10 ENCOUNTER — Ambulatory Visit
Admission: RE | Admit: 2019-09-10 | Discharge: 2019-09-10 | Disposition: A | Discharge status: Home / Self Care | Payer: Medicare HMO | Source: Ambulatory Visit | Attending: Neurology | Admitting: Neurology

## 2019-09-10 DIAGNOSIS — M545 Low back pain, unspecified: Secondary | ICD-10-CM

## 2019-09-12 ENCOUNTER — Telehealth: Payer: Self-pay | Admitting: Neurology

## 2019-09-12 NOTE — Telephone Encounter (Signed)
IMPRESSION: This MRI of the lumbar spine without contrast shows the following: 1.   At L4-L5, there is severe spinal stenosis due to anterolisthesis, disc protrusion, facet hypertrophy and ligamentum flavum hypertrophy.  There is moderately severe right lateral recess stenosis with probable right L5 nerve root compression.  Additionally, due to the severity of the spinal stenosis, other traversing nerve roots could be compressed. 2.   At L3-L4, there is mild to moderate spinal stenosis.  There is potential for left L4 nerve root compression. 3.   There are moderate degenerative changes at the other lumbar levels without spinal stenosis or nerve root compression.  Please call patient, MRI of lumbar spine showed severe spinal stenosis at L4-5 level, probable right L5 nerve root compression  Please make sure she will keep up her follow-up appointment with me, will review MRIs together, make decision after that

## 2019-09-13 NOTE — Telephone Encounter (Signed)
I called pt. I discussed pt's MRI results and recommendations. Pt will keep her appt on 10/13/19 with Dr. Terrace Arabia. Pt verbalized understanding of results. Pt had no questions at this time but was encouraged to call back if questions arise.

## 2019-10-13 ENCOUNTER — Encounter: Payer: Self-pay | Admitting: Neurology

## 2019-10-13 ENCOUNTER — Ambulatory Visit: Payer: Medicare HMO | Admitting: Neurology

## 2019-10-13 VITALS — BP 142/68 | HR 60 | Ht 67.0 in | Wt 174.0 lb

## 2019-10-13 DIAGNOSIS — G6181 Chronic inflammatory demyelinating polyneuritis: Secondary | ICD-10-CM | POA: Diagnosis not present

## 2019-10-13 DIAGNOSIS — R52 Pain, unspecified: Secondary | ICD-10-CM | POA: Diagnosis not present

## 2019-10-13 DIAGNOSIS — M48062 Spinal stenosis, lumbar region with neurogenic claudication: Secondary | ICD-10-CM | POA: Diagnosis not present

## 2019-10-13 NOTE — Progress Notes (Signed)
PATIENT: Haley Valdez DOB: [birth year]  Chief Complaint  Patient presents with  . CIDP    She is doing well on IVIG. Her most treatment was 10/27/19.  . Back Pain    Her low back pain is causing her gait difficulty. She would like to review her lumbar MRI.     HISTORICAL  Haley Valdez is a 59 year old female, seen in refer by her primary care doctor [name].  for evaluation of transient ischemic attack, initial evaluation was on April 22, 2017.  I reviewed and summarized the referring note, she has past medical history of hypothyroidism, peripheral neuropathy, hyperlipidemia,  She works as a Copywriter, advertising for many years, around 1995, she began to experience neck pain, intermittent bilateral feet and hand paresthesia, began to seek neurological care, per patient, patient was diagnosed was carpal tunnel syndromes, peripheral neuropathy,  Then she began to develop unsteady gait,  began to fall since age 56,  She was also diagnosed with hepatitis C received treatment around 2008, around that time, she complains of excessive fatigue, generalized weakness,  In 2015, with her abnormal neck posturing, persistent neck pain, she received Botox injection in May 2015, responded very well, second injection in August 2015, has caused left neck pain, swollen, weakness, has to hold her head up with her hand  Since August 2015, she complains of frequent left-sided neck pain, radiating pain to left occipital, left parietal region, left side headaches,  She had extensive evaluations, I was able to review outside MRI cervical report October 2015, Left C3-4 facet abnormality is deep to the tender palpable  abnormality. The facet is overgrown due to degenerative change. In addition, there is bone marrow and adjacent soft tissue edema and enhancement suggesting active osteoarthritis. Underlying infection not excluded but considered less likely. Correlate with symptoms. Nevidence of  discitis or abscess. There is cervical spondylosis. No other acute abnormality.  No mass or adenopathy detected.  MRI brian in Oct 2018: Mild nonspecific supratentorial small vessel disease, no contrast enhancement,  Repeat MRI in Cervcial in June 2018.  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, with severe left foraminal stenosis, multilevel degenerative disc disease, she is now referred to neurosurgeon, pain management for epidural injection  She continued complaints of intermittent bilateral hand and feet paresthesia, gait abnormality, but today's neurological examination are fairly normal,  Laboratory evaluations in October 2018, INR 0.9 normal CBC,  Update July 22, 2017: She is alone at today's clinical visit, pressed speech, volunteer a lot of informations, long history of ADHD, taking Adderall 10 mg daily, also on polypharmacy treatment, chronic methadone treatment 10 mg 3 times a day, also taking Ativan 1 mg every 8 hours, Cymbalta 60 mg daily, Neurontin 300 mg 3 times a day  She continue complains of neck pain, radiating pain to left shoulder, drop things from her left hand,   I was able to review the EMG nerve conduction study dated April 09, 2017 by Dr. 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  She previously received the Botox injection for her neck pain,  abnormal neck posturing in May again August 2015 by outside neurologist [name], [name], I was able to review injection note, 1. Bilateral Upper trapezius received 40 units  each side 2. Splenius Capiti received 20 units each side 3. Longissimus 20 units each side 4. Semispinalis Capitis 20 units side  She reported since her injection in August 2015, she had a knot at left neck from injection, been persistent, continue have significant neck pain, frequent headaches,  We have personally reviewed MRI of the brain with and without contrast in October 2018: No acute abnormality, scattered subcortical hyperintense T2 signal changes, largest is at left anterior temporal lobe, no contrast enhancement, most consistent with small vessel disease.  MRI of cervical spine multilevel degenerative changes, left facet arthritis at C3-4 with severe left foraminal stenosis, there is no significant canal stenosis.  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  There is also suggestion of temporal dispersion at bilateral tibial proximal stimulation sites.  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 on October 08, 2017, decreased TSH, there was suggestion of her thyroid supplement change, the patient did not make any changes yet,  She continue has mood swings, pressed speech, complains of bilateral feet paresthesia, gait abnormality, urinary urgency, she did have significant  hyperreflexia of bilateral patella but absent ankle reflexes, less dependent sensory changes, MRI of the brain and cervical findings would not explain her hyperreflexia, ordered MRI of thoracic spine  UPDATE November 26 2017: She had her first home IVIG infusion through [facility] on June 13, 14, a week after infusion, she reported doing well, however, about 3 weeks after the infusion, in early July 2019 she noticed a rash broke out at the bottom of her feet, arm, dry scaly skins, there is also raised erythematous dry scaly rash at bilateral leg, she reported similar reaction to interferon for hepatitis C treatment in the past, she was seen by her dermatologist Dr.Walter Elvera Lennox in recent few months, was given the diagnosis of eczema, given a prescription of dexamethasone cream.  Patient stated, [facility] home infusion agent is planning on switching from Octgam to different 10% IVIG privigen, she complains of excessive stress, depression, " she feel manner just want to stay in bed all the time"   IVIG infusion in June did help her some, she feel more energetic,  Less lower extremity spasticity, paresthesia,  MRI of thoracic spine on October 29, 2017 was normal  UPDATE Apr 22 2018: She continued her IVIG infusion, did notice less fatigue, lessening paresthesia and pain, she is going through a lot of stress, complains of worsening anxiety, she wished to continue IVIG treatment, but has to switch to a different provider in [facility] for better coverage from her insurance company,  Update June 01, 2019: She has lost follow-up since last visit in December 2019 due to health  insurance, she is on thyroid supplement now, she had a history of chronic diffuse body achy pain, fibromyalgia, was under pain management, also see psychiatrist, taking Cymbalta 60 mg daily, gabapentin 300 mg 3 times a day, methadone 10 mg every 8 hours, Adderall 10 mg daily  She has been receiving her IVIG through home health on  a monthly basis, she continues to report improvement, continue has mild gait abnormality, especially when closing her eyes  She was diagnosed with hand and feet psoriasis,  Laboratory evaluation from [facility] system in September 2020, normal hemoglobin of 12.8, elevated triglyceride 509, total cholesterol 205, normal CMP, creatinine of 0.96, UDS was positive for methadone, amphetamine,  UPDATE July 13 2019: Patient has been receiving IVIG treatment on a monthly basis, she is doing very well, continue have mild improvement, but continue complains of bilateral upper and lower extremity paresthesia, sometimes achy pain  She had a history of chronic low back pain, worsening since September 2020, now complains of frequent radiating pain to bilateral lower extremity, difficult to sit still, or standing for too long, also has urinary urgency  She return for electrodiagnostic study today, which continues show evidence of demyelinating polyradiculoneuropathy, but mild improvement compared to previous EMG study in April 2019   UPDATE October 13 2019: She continue to improve with IVIG treatment, she tolerated treatment well, with each IVIG infusion, she felt the last bilateral upper and lower extremity paresthesia and neuropathic pain, not improved gait abnormality, she does complains of worsening low back pain, electrodiagnostic study in March 2021 showed evidence of chronic bilateral lumbosacral radiculopathy changes She also complains of urinary urgency, frequency, occasionally stress incontinence We personally reviewed MRI of lumbar spine in May 2021, evidence of severe spinal stenosis at L4-5 due to anterolisthesis, disc protrusion, facet hypertrophy and ligamentum flavum hypertrophy.  There is moderately severe right lateral recess stenosis with probable right L5 nerve root compression.  Additionally, due to the severity of the spinal stenosis, other traversing nerve roots could be compressed.; L3-L4, there  is mild to moderate spinal stenosis.  There is potential for left L4 nerve root compression. There are moderate degenerative changes at the other lumbar levels without spinal stenosis or nerve root compression.  REVIEW OF SYSTEMS: Full 14 system review of systems performed and notable only for as above ALLERGIES: Allergies  Allergen Reactions  . Nitrofurantoin Itching    Redness in feet   . Wasp Venom Protein Other (See Comments)    cellutlitis    HOME MEDICATIONS: Current Outpatient Medications  Medication Sig Dispense Refill  . amphetamine-dextroamphetamine (ADDERALL) 10 MG tablet Take 10 mg by mouth daily.    . brexpiprazole (REXULTI) TABS tablet Take 0.25 mg by mouth daily.    Marland Kitchen CALCIUM-MAGNESIUM-ZINC PO Take 1 tablet by mouth daily.    . DULoxetine (CYMBALTA) 60 MG capsule Take 1 capsule (60 mg total) by mouth daily. 90 capsule 4  . gabapentin (NEURONTIN) 300 MG capsule Take 1 capsule (300 mg total) by mouth 3 (three) times daily. 90 capsule 11  . levothyroxine (SYNTHROID) 112 MCG tablet Take 112 mcg by mouth daily before breakfast.    . liothyronine (CYTOMEL) 5 MCG tablet Take 2 tablets before breakfast and 1 tablet before dinner 270 tablet 0  . LORazepam (ATIVAN) 1 MG tablet Take 1 mg by mouth every 8 (eight) hours.    . methadone (DOLOPHINE) 10 MG tablet Take 10 mg by mouth every 8 (eight) hours.    . traZODone (DESYREL) 50 MG  tablet Take 50 mg by mouth at bedtime.     No current facility-administered medications for this visit.    PAST MEDICAL HISTORY: Past Medical History:  Diagnosis Date  . ADHD   . Chronic neck pain   . Depression   . Fibromyalgia   . Hyperlipemia   . Neuropathy   . Raynaud's disease   . Thyroid disease     PAST SURGICAL HISTORY: Past Surgical History:  Procedure Laterality Date  . arm surgery Right    Fracture  . CESAREAN SECTION    . LEG SURGERY Right    Fracture  . TUBAL LIGATION      FAMILY HISTORY: Family History  Problem  Relation Age of Onset  . Heart attack Mother   . Thyroid disease Mother   . Stroke Father   . Thyroid disease Sister     SOCIAL HISTORY:  Social History   Socioeconomic History  . Marital status: Married    Spouse name: Not on file  . Number of children: 2  . Years of education: 56  . Highest education level: Associate degree: occupational, Hotel manager, or vocational program  Occupational History  . Occupation: Designer, multimedia  Tobacco Use  . Smoking status: Current Every Day Smoker    Packs/day: 1.00    Types: Cigarettes  . Smokeless tobacco: Never Used  Vaping Use  . Vaping Use: Never used  Substance and Sexual Activity  . Alcohol use: Yes    Comment: occ  . Drug use: No  . Sexual activity: Not on file  Other Topics Concern  . Not on file  Social History Narrative   Lives at home with her boyfriend.   Right-handed.   1 cup caffeine per day.   Social Determinants of Health   Financial Resource Strain:   . Difficulty of Paying Living Expenses:   Food Insecurity:   . Worried About Charity fundraiser in the Last Year:   . Arboriculturist in the Last Year:   Transportation Needs:   . Film/video editor (Medical):   Marland Kitchen Lack of Transportation (Non-Medical):   Physical Activity:   . Days of Exercise per Week:   . Minutes of Exercise per Session:   Stress:   . Feeling of Stress :   Social Connections:   . Frequency of Communication with Friends and Family:   . Frequency of Social Gatherings with Friends and Family:   . Attends Religious Services:   . Active Member of Clubs or Organizations:   . Attends Archivist Meetings:   Marland Kitchen Marital Status:   Intimate Partner Violence:   . Fear of Current or Ex-Partner:   . Emotionally Abused:   Marland Kitchen Physically Abused:   . Sexually Abused:      PHYSICAL EXAM   Vitals:   10/13/19 1012  BP: (!) 142/68  Pulse: 60  Weight: 174 lb (78.9 kg)  Height: '5\' 7"'  (1.702 m)   Not recorded     Body mass  index is 27.25 kg/m.  PHYSICAL EXAMNIATION:  NEUROLOGICAL EXAM:  MENTAL STATUS: Speech:    Speech is normal; fluent and spontaneous with normal comprehension.  Cognition:     Orientation to time, place and person     Normal recent and remote memory     Normal Attention span and concentration     Normal Language, naming, repeating,spontaneous speech     Fund of knowledge   CRANIAL NERVES: CN II: Visual fields are  full to confrontation.  Pupils are round equal and briskly reactive to light. CN III, IV, VI: extraocular movement are normal. No ptosis. CN V: Facial sensation is intact to pinprick in all 3 divisions bilaterally. Corneal responses are intact.  CN VII: Face is symmetric with normal eye closure and smile. CN VIII: Hearing is normal to rubbing fingers CN IX, X: Palate elevates symmetrically. Phonation is normal. CN XI: Head turning and shoulder shrug are intact CN XII: Tongue is midline with normal movements and no atrophy.  MOTOR: She has mild bilateral toe extension, flexion weakness  REFLEXES: Reflexes are absent and symmetric at the biceps, triceps, 2/2 knees, and absent at bilateral ankle. Plantar responses are flexor.  SENSORY: Length dependent decreased light touch, pinprick and  vibratory sensation to ankle levels  COORDINATION: Rapid alternating movements and fine finger movements are intact. There is no dysmetria on finger-to-nose and heel-knee-shin.    GAIT/STANCE: Need to push up to get up from seated position, steady, difficulty standing up on tiptoe, she did better with heels, mild positive Romberg signs.   DIAGNOSTIC DATA (LABS, IMAGING, TESTING) - I reviewed patient records, labs, notes, testing and imaging myself where available.   ASSESSMENT AND PLAN  Haley Valdez is a 59 y.o. female   Chronic demyelinating polyradiculoneuropathy.  Repeat EMG nerve conduction study on July 13, 2019, continue showed features of demyelinating neuropathy,  evident by prolonged distal latency, slow conduction velocity.  Prolonged F-wave latency,  Spinal fluid testing in 2019 showed total protein of 110, cytoalbumin dissociation, WBC of 0.  She received her first round of IVIG on June 13, 14 2019, 2 g/kg for IV infusion, maintenance dose 1 g/kg through home infusion since, reported mild to moderate improvement,  The atypical features is significant hyperreflexia of bilateral upper extremity and patella, there was no significant pathology on previous MRI of the brain, cervical, thoracic spine.  Worsening low back pain, radiating pain to bilateral lower extremity,  MRI of lumbar spine in May 2021 showed severe stenosis at L4-5  Referral to neurosurgeon for evaluation  Diffuse body achy pain  Is under pain management, currently taking Cymbalta 60 mg daily, gabapentin 300 mg 3 times a day, also on methadone 10 mg every 8 hours, trazodone, Ativan, Adderall.  Marcial Pacas, M.D. Ph.D.  [facility] [address], [place], [place] [address] Ph: [phone] Fax: [phone]  CC: [name]., MD

## 2019-10-14 ENCOUNTER — Encounter: Payer: Self-pay | Admitting: Neurology

## 2019-10-14 DIAGNOSIS — R52 Pain, unspecified: Secondary | ICD-10-CM | POA: Insufficient documentation

## 2020-04-04 ENCOUNTER — Ambulatory Visit: Payer: Medicare HMO | Admitting: Psychiatry

## 2020-04-05 ENCOUNTER — Other Ambulatory Visit: Payer: Self-pay

## 2020-04-05 ENCOUNTER — Ambulatory Visit (INDEPENDENT_AMBULATORY_CARE_PROVIDER_SITE_OTHER): Payer: Medicare HMO | Admitting: Psychiatry

## 2020-04-05 DIAGNOSIS — Z630 Problems in relationship with spouse or partner: Secondary | ICD-10-CM

## 2020-04-05 DIAGNOSIS — F331 Major depressive disorder, recurrent, moderate: Secondary | ICD-10-CM | POA: Diagnosis not present

## 2020-04-05 DIAGNOSIS — F411 Generalized anxiety disorder: Secondary | ICD-10-CM

## 2020-04-05 DIAGNOSIS — M48062 Spinal stenosis, lumbar region with neurogenic claudication: Secondary | ICD-10-CM

## 2020-04-05 DIAGNOSIS — G6181 Chronic inflammatory demyelinating polyneuritis: Secondary | ICD-10-CM
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
## 2020-04-10 ENCOUNTER — Telehealth: Payer: Self-pay | Admitting: Neurology

## 2020-04-10 NOTE — Telephone Encounter (Addendum)
I returned the call to the pharmacist, Will, at [facility].  States the patient reached out to [facility] and would like to start using them for her IVIG infusions. He will need updated clinical notes that include her current IVIG brand, dosage and schedule. He will also need her insurance information and demographics. This information will need to be faxed to [phone].   Next infusion not due until the end of December 2021.   She was last seen 10/13/19. She has a pending appt with Margie Ege, NP on 04/16/20. This information will be provided to Alverda Skeans, Charity fundraiser.

## 2020-04-10 NOTE — Telephone Encounter (Signed)
[facility] is asking for a call from [facility] to discuss pt's [facility].  When calling back please use option 1

## 2020-04-11 NOTE — Telephone Encounter (Signed)
Noted, received at my desk fro 04-16-20 appt.

## 2020-04-11 NOTE — Telephone Encounter (Signed)
Will from [facility] also sent a pre-filled referral form to be included in the packet to be faxed after her appt on 04/16/20. This was also provided to [place], Charity fundraiser.

## 2020-04-16 ENCOUNTER — Encounter: Payer: Self-pay | Admitting: Neurology

## 2020-04-16 ENCOUNTER — Ambulatory Visit (INDEPENDENT_AMBULATORY_CARE_PROVIDER_SITE_OTHER): Payer: Medicare HMO | Admitting: Neurology

## 2020-04-16 ENCOUNTER — Other Ambulatory Visit: Payer: Self-pay

## 2020-04-16 VITALS — BP 130/80 | HR 75 | Ht 67.0 in | Wt 160.8 lb

## 2020-04-16 DIAGNOSIS — R52 Pain, unspecified: Secondary | ICD-10-CM | POA: Diagnosis not present

## 2020-04-16 DIAGNOSIS — M5440 Lumbago with sciatica, unspecified side: Secondary | ICD-10-CM

## 2020-04-16 DIAGNOSIS — G6181 Chronic inflammatory demyelinating polyneuritis: Secondary | ICD-10-CM

## 2020-04-16 NOTE — Progress Notes (Signed)
PATIENT: Haley Valdez DOB: 07-19-1960  REASON FOR VISIT: follow up HISTORY FROM: patient  HISTORY OF PRESENT ILLNESS: Today 04/16/20  HISTORY Haley Valdez is a 59 year old female, seen in refer by her primary care doctor [name].  for evaluation of transient ischemic attack, initial evaluation was on April 22, 2017.  I reviewed and summarized the referring note, she has past medical history of hypothyroidism, peripheral neuropathy, hyperlipidemia,  She works as a Copywriter, advertising for many years, around 1995, she began to experience neck pain, intermittent bilateral feet and hand paresthesia, began to seek neurological care, per patient, patient was diagnosed was carpal tunnel syndromes, peripheral neuropathy,  Then she began to develop unsteady gait,  began to fall since age 52,  She was also diagnosed with hepatitis C received treatment around 2008, around that time, she complains of excessive fatigue, generalized weakness,  In 2015, with her abnormal neck posturing, persistent neck pain, she received Botox injection in May 2015, responded very well, second injection in August 2015, has caused left neck pain, swollen, weakness, has to hold her head up with her hand  Since August 2015, she complains of frequent left-sided neck pain, radiating pain to left occipital, left parietal region, left side headaches,  She had extensive evaluations, I was able to review outside MRI cervical report October 2015, Left C3-4 facet abnormality is deep to the tender palpable  abnormality. The facet is overgrown due to degenerative change. In addition, there is bone marrow and adjacent soft tissue edema and enhancement suggesting active osteoarthritis. Underlying infection not excluded but considered less likely. Correlate with symptoms. Nevidence of discitis or abscess. There is cervical spondylosis. No other acute abnormality.  No mass or adenopathy detected.  MRI brian in  Oct 2018: Mild nonspecific supratentorial small vessel disease, no contrast enhancement,  Repeat MRI in Cervcial in June 2018.  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, with severe left foraminal stenosis, multilevel degenerative disc disease, she is now referred to neurosurgeon, pain management for epidural injection  She continued complaints of intermittent bilateral hand and feet paresthesia, gait abnormality, but today's neurological examination are fairly normal,  Laboratory evaluations in October 2018, INR 0.9 normal CBC,  Update July 22, 2017: She is alone at today's clinical visit, pressed speech, volunteer a lot of informations, long history of ADHD, taking Adderall 10 mg daily, also on polypharmacy treatment, chronic methadone treatment 10 mg 3 times a day, also taking Ativan 1 mg every 8 hours, Cymbalta 60 mg daily, Neurontin 300 mg 3 times a day  She continue complains of neck pain, radiating pain to left shoulder, drop things from her left hand,   I was able to review the EMG nerve conduction study dated April 09, 2017 by Dr. 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  She previously received the Botox injection for her neck pain, abnormal neck posturing in May again August 2015 by outside neurologist [name], [name], I was able to review injection note, 1. Bilateral Upper trapezius received 40 units each side 2. Splenius Capiti received 20 units each  3. Longissimus 20 units each side °4. Semispinalis Capitis 20  units side °  °She reported since her injection in August 2015, she had a knot at left neck from injection, been persistent, continue have significant neck pain, frequent headaches, °  °We have personally reviewed MRI of the brain with and without contrast in October 2018: No acute abnormality, scattered subcortical hyperintense T2 signal changes, largest is at left anterior temporal lobe, no contrast enhancement, most consistent with small vessel disease. °  °MRI of cervical spine multilevel degenerative changes, left facet arthritis at C3-4 with severe left foraminal stenosis, there is no significant canal stenosis. 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  There is also suggestion of temporal dispersion at bilateral tibial proximal stimulation sites. 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 Ajay Kumar on October 08, 2017, decreased TSH, there was suggestion of her thyroid supplement change, the patient did not make any changes yet, °  °She continue has mood swings, pressed speech, complains of bilateral feet paresthesia, gait abnormality, urinary urgency, she did have significant hyperreflexia of bilateral patella but absent ankle reflexes, less dependent sensory changes, MRI of the brain and cervical  findings would not explain her hyperreflexia, ordered MRI of thoracic spine °  °UPDATE November 26 2017: °She had her first home IVIG infusion through [facility] on June 13, 14, a week after infusion, she reported doing well, however, about 3 weeks after the infusion, in early July 2019 she noticed a rash broke out at the bottom of her feet, arm, dry scaly skins, there is also raised erythematous dry scaly rash at bilateral leg, she reported similar reaction to interferon for hepatitis C treatment in the past, she was seen by her dermatologist Dr.Walter Whitworth in recent few months, was given the diagnosis of eczema, given a prescription of dexamethasone cream. °  °Patient stated, [facility] home infusion agent is planning on switching from Octgam to different 10% IVIG privigen, she complains of excessive stress, depression, " she feel manner just want to stay in bed all the time" °  ° IVIG infusion in June did help her some, she feel more energetic,  Less lower extremity spasticity, paresthesia, °  °MRI of thoracic spine on October 29, 2017 was normal °  °UPDATE Apr 22 2018: °She continued her IVIG infusion, did notice less fatigue, lessening paresthesia and pain, she is going through a lot of stress, complains of worsening anxiety, she wished to continue IVIG treatment, but has to switch to a different provider in [place] system for better coverage from her insurance company, °  °Update June 01, 2019: °She has lost follow-up since last visit in December 2019 due to health insurance, she is on thyroid supplement now, she had a history of chronic diffuse body achy pain, fibromyalgia, was under pain management, also see psychiatrist, taking Cymbalta 60 mg daily, gabapentin 300 mg 3 times a day, methadone   methadone 10 mg every 8 hours, Adderall 10 mg daily  She has been receiving her IVIG through home health on a monthly basis, she continues to report improvement, continue has mild gait abnormality, especially when closing her  eyes  She was diagnosed with hand and feet psoriasis,  Laboratory evaluation from [facility] system in September 2020, normal hemoglobin of 12.8, elevated triglyceride 509, total cholesterol 205, normal CMP, creatinine of 0.96, UDS was positive for methadone, amphetamine,  UPDATE July 13 2019: Patient has been receiving IVIG treatment on a monthly basis, she is doing very well, continue have mild improvement, but continue complains of bilateral upper and lower extremity paresthesia, sometimes achy pain  She had a history of chronic low back pain, worsening since September 2020, now complains of frequent radiating pain to bilateral lower extremity, difficult to sit still, or standing for too long, also has urinary urgency  She return for electrodiagnostic study today, which continues show evidence of demyelinating polyradiculoneuropathy, but mild improvement compared to previous EMG study in April 2019   UPDATE October 13 2019: She continue to improve with IVIG treatment, she tolerated treatment well, with each IVIG infusion, she felt the last bilateral upper and lower extremity paresthesia and neuropathic pain, not improved gait abnormality, she does complains of worsening low back pain, electrodiagnostic study in March 2021 showed evidence of chronic bilateral lumbosacral radiculopathy changes She also complains of urinary urgency, frequency, occasionally stress incontinence We personally reviewed MRI of lumbar spine in May 2021, evidence of severe spinal stenosis at L4-5 due to anterolisthesis, disc protrusion, facet hypertrophy and ligamentum flavum hypertrophy. There is moderately severe right lateral recess stenosis with probable right L5 nerve root compression. Additionally, due to the severity of the spinal stenosis, other traversing nerve roots could be compressed.; L3-L4, there is mild to moderate spinal stenosis. There is potential for left L4 nerve root compression. There are  moderate degenerative changes at the other lumbar levels without spinal stenosis or nerve root compression.  Update April 16, 2020 SS: Remains on IVIG, every 28 days, is switching [facility]ies from [facility] to [facility].  Continues to have benefit from IVIG, since starting in 2019, muscle strength, gait has greatly improved. No recent falls, did have 1 recent trip on carpet.  Her balance is improved.  She walks 2-1/2 miles a day, enjoys walking her dog.  She is exercising, building core strength.  Did follow-up with neurosurgery, recommended surgery and a fusion, she decided to do exercises 1st before considering surgery.  No longer seeing pain management, was able to come off methadone over 6 months.  See psychiatry.  Currently taking Cymbalta and gabapentin for achy pain.  The week before IVIG, will feel fatigued.  More stress today, her car broke down on the way here. IVIG due next week.   REVIEW OF SYSTEMS: Out of a complete 14 system review of symptoms, the patient complains only of the following symptoms, and all other reviewed systems are negative.  Fatigue  ALLERGIES: Allergies  Allergen Reactions   Nitrofurantoin Itching    Redness in feet    Wasp Venom Protein Other (See Comments)    cellutlitis    HOME MEDICATIONS: Outpatient Medications Prior to Visit  Medication Sig Dispense Refill   amphetamine-dextroamphetamine (ADDERALL) 10 MG tablet Take 10 mg by mouth daily.     brexpiprazole (REXULTI) TABS tablet Take 0.25 mg by mouth daily.     CALCIUM-MAGNESIUM-ZINC PO Take 1 tablet by mouth daily.     DULoxetine (CYMBALTA)  60 MG capsule Take 1 capsule (60 mg total) by mouth daily. 90 capsule 4   gabapentin (NEURONTIN) 300 MG capsule Take 1 capsule (300 mg total) by mouth 3 (three) times daily. 90 capsule 11   levothyroxine (SYNTHROID) 112 MCG tablet Take 112 mcg by mouth daily before breakfast.     liothyronine (CYTOMEL) 5 MCG tablet Take 2 tablets before breakfast and 1 tablet  before dinner 270 tablet 0   LORazepam (ATIVAN) 1 MG tablet Take 1 mg by mouth every 8 (eight) hours.     methadone (DOLOPHINE) 10 MG tablet Take 10 mg by mouth every 8 (eight) hours.     traZODone (DESYREL) 50 MG tablet Take 50 mg by mouth at bedtime.     No facility-administered medications prior to visit.    PAST MEDICAL HISTORY: Past Medical History:  Diagnosis Date   ADHD    Chronic neck pain    Depression    Fibromyalgia    Hyperlipemia    Neuropathy    Raynaud's disease    Thyroid disease     PAST SURGICAL HISTORY: Past Surgical History:  Procedure Laterality Date   arm surgery Right    Fracture   CESAREAN SECTION     LEG SURGERY Right    Fracture   TUBAL LIGATION      FAMILY HISTORY: Family History  Problem Relation Age of Onset   Heart attack Mother    Thyroid disease Mother    Stroke Father    Thyroid disease Sister     SOCIAL HISTORY: Social History   Socioeconomic History   Marital status: Married    Spouse name: Not on file   Number of children: 2   Years of education: 14   Highest education level: Associate degree: occupational, Hotel manager, or vocational program  Occupational History   Occupation: [employer]  Tobacco Use   Smoking status: Current Every Day Smoker    Packs/day: 1.00    Types: Cigarettes   Smokeless tobacco: Never Used  Scientific laboratory technician Use: Never used  Substance and Sexual Activity   Alcohol use: Yes    Comment: occ   Drug use: No   Sexual activity: Not on file  Other Topics Concern   Not on file  Social History Narrative   Lives at home with her boyfriend.   Right-handed.   1 cup caffeine per day.   Social Determinants of Health   Financial Resource Strain: Not on file  Food Insecurity: Not on file  Transportation Needs: Not on file  Physical Activity: Not on file  Stress: Not on file  Social Connections: Not on file  Intimate Partner Violence: Not on file    PHYSICAL EXAM  Vitals:   04/16/20 1128  BP: 130/80  Pulse: 75  Weight: 160 lb 12.8 oz (72.9 kg)  Height: 5' 7" (1.702 m)   Body mass index is 25.18 kg/m.  Generalized: Well developed, in no acute distress   Neurological examination  Mentation: Alert oriented to time, place, history taking. Follows all commands speech and language fluent Cranial nerve II-XII: Pupils were equal round reactive to light. Extraocular movements were full, visual field were full on confrontational test. Facial sensation and strength were normal. Head turning and shoulder shrug  were normal and symmetric. Motor: Strength is intact, no significant muscle weakness, strong grip strength bilaterally, strong dorsiflexion bilaterally Sensory: Sensory testing is intact to soft touch on all 4 extremities. No evidence of extinction is noted.  Coordination: Cerebellar testing reveals good finger-nose-finger and heel-to-shin bilaterally.  Gait and station: Gait is normal. Tandem gait is unsteady, Romberg is positive. Better at walking on heels than toes. Reflexes: Deep tendon reflexes are normal today, 2+   DIAGNOSTIC DATA (LABS, IMAGING, TESTING) - I reviewed patient records, labs, notes, testing and imaging myself where available.  Lab Results  Component Value Date   WBC 6.7 07/16/2015   HGB 13.7 07/16/2015   HCT 41.5 07/16/2015   MCV 97.9 07/16/2015   PLT 205 07/16/2015      Component Value Date/Time   NA 140 07/16/2015 1750   K 4.0 07/16/2015 1750   CL 102 07/16/2015 1750   CO2 32 07/16/2015 1750   GLUCOSE 87 07/16/2015 1750   BUN 11 07/16/2015 1750   CREATININE 0.91 07/16/2015 1750   CALCIUM 8.9 07/16/2015 1750   PROT 7.1 04/22/2017 0837   ALBUMIN 4.1 07/16/2015 1750   AST 20 07/16/2015 1750   ALT 11 (L) 07/16/2015 1750   ALKPHOS 48 07/16/2015 1750   BILITOT 0.8 07/16/2015 1750   GFRNONAA >60 07/16/2015 1750   GFRAA >60 07/16/2015 1750   No results found for: CHOL, HDL, LDLCALC, LDLDIRECT,  TRIG, CHOLHDL Lab Results  Component Value Date   HGBA1C 5.4 04/22/2017   Lab Results  Component Value Date   OZHYQMVH84 696 04/22/2017   Lab Results  Component Value Date   TSH <0.01 (L) 11/17/2017    ASSESSMENT AND PLAN 59 y.o. year old female  has a past medical history of ADHD, Chronic neck pain, Depression, Fibromyalgia, Hyperlipemia, Neuropathy, Raynaud's disease, and Thyroid disease. here with:  1.Chronic demyelinating polyradiculoneuropathy. -Overall stable, continues to benefit from IVIG in terms of muscle strength and gait  -Repeat EMG nerve conduction study on July 13, 2019, continue showed features of demyelinating neuropathy, evident by prolonged distal latency, slow conduction velocity.  Prolonged F-wave latency,  -Spinal fluid testing in 2019 showed total protein of 110, cytoalbumin dissociation, WBC of 0.   -She received her first round of IVIG on June 13, 14 2019, 2 g/kg for IV infusion, maintenance dose 1 g/kg through home infusion since, reported mild to moderate improvement   -The atypical features is significant hyperreflexia of bilateral upper extremity and patella, there was no significant pathology on previous MRI of the brain, cervical, thoracic spine.  -Continue IVIG switching to [facility], due next week  2.  Worsening low back pain, radiating pain to bilateral lower extremity -MRI lumbar spine May 2021 showed severe stenosis at L4-5 -Referred to neurosurgery, indicated surgery and fusion was needed, she has decided to hold off, manages with exercise  3.  Diffuse body achy pain -No longer seeing pain management, has come off methadone -Currently taking Cymbalta, gabapentin, trazodone, Ativan, Adderall -We have filled Cymbalta and gabapentin, but not since 2019, unsure who is filling -Follow-up in 6 months or sooner if needed  I spent 30 minutes of face-to-face and non-face-to-face time with patient.  This included previsit chart review, lab review, study  review, order entry, electronic health record documentation, patient education.  Butler Denmark, AGNP-C, DNP 04/16/2020, 12:14 PM [facility] [address], [place], [place] [address] [phone]

## 2020-04-16 NOTE — Patient Instructions (Addendum)
Continue current medications Continue IVIG  Continue follow-up with neurosurgery for the back issues See you back in 6 months

## 2020-04-17 ENCOUNTER — Telehealth: Payer: Self-pay | Admitting: Neurology

## 2020-04-17 NOTE — Telephone Encounter (Signed)
Charlie from [employer]. was calling to see if RN received orders. Please advise.   Best contact: [phone]

## 2020-04-18 NOTE — Telephone Encounter (Signed)
Have received and need SS/NP signature then will fax.

## 2020-04-18 NOTE — Telephone Encounter (Signed)
Fax confirmation received.  [facility].

## 2020-04-18 NOTE — Telephone Encounter (Signed)
Spoke to [facility] and they do have the information.

## 2020-05-05 DIAGNOSIS — I10 Essential (primary) hypertension: Secondary | ICD-10-CM

## 2020-05-05 HISTORY — DX: Essential (primary) hypertension: I10

## 2020-05-10 ENCOUNTER — Ambulatory Visit (INDEPENDENT_AMBULATORY_CARE_PROVIDER_SITE_OTHER): Payer: Medicare HMO | Admitting: Psychiatry

## 2020-05-10 ENCOUNTER — Other Ambulatory Visit: Payer: Self-pay

## 2020-05-10 DIAGNOSIS — Z630 Problems in relationship with spouse or partner: Secondary | ICD-10-CM

## 2020-05-10 DIAGNOSIS — F3341 Major depressive disorder, recurrent, in partial remission: Secondary | ICD-10-CM | POA: Diagnosis not present

## 2020-05-10 DIAGNOSIS — G6181 Chronic inflammatory demyelinating polyneuritis: Secondary | ICD-10-CM | POA: Diagnosis not present

## 2020-05-10 DIAGNOSIS — F4323 Adjustment disorder with mixed anxiety and depressed mood: Secondary | ICD-10-CM

## 2020-05-10 DIAGNOSIS — F411 Generalized anxiety disorder: Secondary | ICD-10-CM

## 2020-05-10 NOTE — Progress Notes (Signed)
Psychotherapy Progress Note [facility] Haley Moore, PhD LP  Patient ID: Haley Valdez     MRN: [id number] Therapy format: Individual psychotherapy Date: 05/10/2020      Start: 2:20p     Stop: 3:06p     Time Spent: 46 min Location: In-person   Session narrative (presenting needs, interim history, self-report of stressors and symptoms, applications of prior therapy, status changes, and interventions made in session) Big changes since first met.  Haley Valdez had seemed to be guarding his phone, left it, so she checked it ad discovered the actual messages from September that confirmed he had actually reached out on the Facebook dating site.  Had an anxious outrage confrontation after drinking some Fireball, got a more honest, emotionally important conversation which revealed he thought he was being pushed out by her, he doesn't want to leave, and her reaction in September (kicking him out, changing the locks when she saw a "like" message on Facebook dating site) had set a tone to understand her that way.  Complicated, in that he has fed the image of a man who is not necessarily committed, but this was sincere, humble, and reassuring that he is not going anywhere, he is truly interested in her.  All seems forgiven now, and they have been successfully intimate once since, despite his age (13), fatigue, and Peyronie's.  4/10 confidence in the relationship now.  Still wanting for more affection and affirmation, her two top love languages, but back in the same bed again, actually were back in the bed a week or two after 1st therapy session.  Affirmed and encouraged.  Probed awareness of whether something in her approach helped overcome the doubts and fears -- not sure.  Identified some compromise conversation earlier about sharing the bedroom more.  Concluded today that he has rehabilitated in her eyes and that she was courageous and mature in bringing it up, honest in letting her anxiety and fear  of being rejected speak instead of covering with aggression.  Pointed out how the bigger risk of anxiety now is to see things going some better and deal with the fear being let down again.  Understands and commits to try.   Meanwhile, has effectively asked Linna Valdez to back off soliciting her.  Appreciative of him taking the pressure off.  No temptation to keep him on the side in case.  Discussed briefly ways to structure her time further, now that she is involuntarily retired but recovering.  Suggested the possibility of volunteer work, e.g., helping kids read.  Resolved to check with volunteer resource center.    Therapeutic modalities: Cognitive Behavioral Therapy, Solution-Oriented/Positive Psychology and Assertiveness/Communication  Mental Status/Observations:  Appearance:   Casual     Behavior:  Appropriate  Motor:  Normal  Speech/Language:   Clear and Coherent  Affect:  Appropriate and brighter  Mood:  anxious and much less  Thought process:  normal  Thought content:    WNL  Sensory/Perceptual disturbances:    WNL  Orientation:  Fully oriented  Attention:  Good    Concentration:  Fair  Memory:  WNL  Insight:    Good  Judgment:   Fair  Impulse Control:  Fair   Risk Assessment: Danger to Self: No Self-injurious Behavior: No Danger to Others: No Physical Aggression / Violence: No Duty to Warn: No Access to Firearms a concern: No  Assessment of progress:  progressing  Diagnosis:   ICD-10-CM   1. Relationship problem between partners  Z63.0  2. Generalized anxiety disorder (r/o PTSD)  F41.1   3. Major depressive disorder, recurrent, in partial remission ([place])  F33.41   4. 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 Inquire with Investment banker, operational center or particular possibilities as interested  . Option to check with personal care agencies about opportunities and qualifications . Other recommendations/advice as may be noted  above . Continue to utilize previously learned skills ad lib . Maintain medication as prescribed and work faithfully with relevant prescriber(s) if any changes are desired or seem indicated . Call the clinic on-call service, present to ER, or call 911 if any life-threatening psychiatric crisis Return for session(s) already scheduled. . Already scheduled visit in this office 05/24/2020.  Haley Serve, PhD Haley Moore, PhD LP Clinical Psychologist, [facility] [address] [place], [place] [address] [phone]

## 2020-05-24 ENCOUNTER — Other Ambulatory Visit: Payer: Self-pay

## 2020-05-24 ENCOUNTER — Ambulatory Visit (INDEPENDENT_AMBULATORY_CARE_PROVIDER_SITE_OTHER): Payer: Medicare HMO | Admitting: Psychiatry

## 2020-05-24 DIAGNOSIS — F411 Generalized anxiety disorder: Secondary | ICD-10-CM | POA: Diagnosis not present

## 2020-05-24 DIAGNOSIS — F4323 Adjustment disorder with mixed anxiety and depressed mood: Secondary | ICD-10-CM | POA: Diagnosis not present
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
## 2020-06-05 ENCOUNTER — Telehealth: Payer: Self-pay | Admitting: Neurology

## 2020-06-05 NOTE — Telephone Encounter (Signed)
Pt being told that a PA is needed for her IVIG treatment, please call.

## 2020-06-05 NOTE — Telephone Encounter (Signed)
I called the patient back. She is now getting her IVIG infusions through [facility] [phone]). States she had two infusions in January that have already been paid. She spoke to the pharmacist (Will at [facility]) who told her there are no issues. Her next infusion is scheduled for next week. She has confirmed her next delivery of Privigen with the pharmacy (arriving on 06/08/20). No further action should be required.

## 2020-06-07 ENCOUNTER — Other Ambulatory Visit: Payer: Self-pay

## 2020-06-07 ENCOUNTER — Ambulatory Visit (INDEPENDENT_AMBULATORY_CARE_PROVIDER_SITE_OTHER): Payer: Medicare HMO | Admitting: Psychiatry

## 2020-06-07 DIAGNOSIS — F4323 Adjustment disorder with mixed anxiety and depressed mood: Secondary | ICD-10-CM

## 2020-06-07 DIAGNOSIS — F411 Generalized anxiety disorder: Secondary | ICD-10-CM

## 2020-06-07 DIAGNOSIS — G6181 Chronic inflammatory demyelinating polyneuritis: Secondary | ICD-10-CM
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
## 2020-06-21 ENCOUNTER — Ambulatory Visit: Payer: Medicare HMO | Admitting: Psychiatry

## 2020-07-05 ENCOUNTER — Ambulatory Visit: Payer: Medicare HMO | Admitting: Psychiatry

## 2020-07-19 ENCOUNTER — Ambulatory Visit (INDEPENDENT_AMBULATORY_CARE_PROVIDER_SITE_OTHER): Payer: Medicare HMO | Admitting: Psychiatry

## 2020-07-19 ENCOUNTER — Other Ambulatory Visit: Payer: Self-pay

## 2020-07-19 DIAGNOSIS — F4323 Adjustment disorder with mixed anxiety and depressed mood: Secondary | ICD-10-CM

## 2020-07-19 DIAGNOSIS — G6181 Chronic inflammatory demyelinating polyneuritis: Secondary | ICD-10-CM | POA: Diagnosis not present

## 2020-07-19 DIAGNOSIS — Z8619 Personal history of other infectious and parasitic diseases: Secondary | ICD-10-CM | POA: Diagnosis not present

## 2020-07-19 DIAGNOSIS — F411 Generalized anxiety disorder: Secondary | ICD-10-CM | POA: Diagnosis not present

## 2020-07-19 NOTE — Progress Notes (Signed)
Psychotherapy Progress Note [facility] [name], PhD LP  Patient ID: Haley Valdez     MRN: [id number] Therapy format: Individual psychotherapy Date: 07/19/2020      Start: 1:15p     Stop: 2:00p     Time Spent: 45 min Location: In-person   Session narrative (presenting needs, interim history, self-report of stressors and symptoms, applications of prior therapy, status changes, and interventions made in session) Has trip planned July-Aug to visit [place] and Immunologist for longterm living there.  Was worried [name] would beg off and leave her to go by herself, but then he put in for his passport.  When challenged, does not feel confident [name] would tell her the truth if he does not want to go.  It does matter for security and comfort that he go, since he is fluent in Spanish, is Latin himself, and she would be more of a target for robbery alone.  Does want a simpler, more affordable life, further from the costs and hazards (especially now, with the specter of intercontinental war) and sees it being available there.  Discussed further communication with [name], encouraged that he seems to be coming around, and challenge is to hope without counting, be OK either way it turns out.  Koleen Nimrod remains out of jail, may have cirrhosis and/or Hep B, has real needs for help from [name].  Clear that she will let him, and they have an understanding that she won't be drawn into unlimited caregiving or potential manipulation.  Still, if [name] doesn't share much, she'll worry he's hiding something.  Encouraged trust his word anyway, but OK to ask if he would disclose if he was worried about her reaction.  Discussed parameters and eventualities of living in [place].  Seems to have many factors covered.  Therapeutic modalities: Cognitive Behavioral Therapy and Solution-Oriented/Positive Psychology  Mental Status/Observations:  Appearance:   unusually youthful dress      Behavior:  Appropriate  Motor:  Normal  Speech/Language:   Clear and Coherent  Affect:  Appropriate  Mood:  eager, some anxiety  Thought process:  normal  Thought content:    WNL  Sensory/Perceptual disturbances:    WNL   Orientation:  Fully oriented  Attention:  Good    Concentration:  Fair  Memory:  WNL  Insight:    Fair  Judgment:   Good  Impulse Control:  Good   Risk Assessment: Danger to Self: No Self-injurious Behavior: No Danger to Others: No Physical Aggression / Violence: No Duty to Warn: No Access to Firearms a concern: No  Assessment of progress:  progressing  Diagnosis:   ICD-10-CM   1. Generalized anxiety disorder (r/o PTSD)  F41.1   2. Adjustment disorder with mixed anxiety and depressed mood  F43.23   3. 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 History of hepatitis C  Z86.19    Plan:  . Try asking [name] if he would tell her if he meant he does not want to go to [place] . Other recommendations/advice as may be noted above . Continue to utilize previously learned skills ad lib . Maintain medication as prescribed and work faithfully with relevant prescriber(s) if any changes are desired or seem indicated . Call the clinic on-call service, present to ER, or call 911 if any life-threatening psychiatric crisis Return in about 2 weeks (around 08/02/2020). . Already scheduled visit in this office 08/02/2020.  Robley Fries, PhD [name], PhD LP Clinical Psychologist, [facility] [address], [place], [place] [address] [phone]
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
## 2020-08-02 ENCOUNTER — Ambulatory Visit: Payer: Medicare HMO | Admitting: Psychiatry

## 2020-08-23 ENCOUNTER — Other Ambulatory Visit: Payer: Self-pay

## 2020-08-23 ENCOUNTER — Ambulatory Visit (INDEPENDENT_AMBULATORY_CARE_PROVIDER_SITE_OTHER): Payer: Medicare HMO | Admitting: Psychiatry

## 2020-08-23 DIAGNOSIS — Z630 Problems in relationship with spouse or partner: Secondary | ICD-10-CM

## 2020-08-23 DIAGNOSIS — F411 Generalized anxiety disorder: Secondary | ICD-10-CM

## 2020-08-23 DIAGNOSIS — M48062 Spinal stenosis, lumbar region with neurogenic claudication: Secondary | ICD-10-CM

## 2020-08-23 DIAGNOSIS — F3341 Major depressive disorder, recurrent, in partial remission: Secondary | ICD-10-CM | POA: Diagnosis not present

## 2020-08-23 DIAGNOSIS — G6181 Chronic inflammatory demyelinating polyneuritis: Secondary | ICD-10-CM
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
## 2020-09-13 ENCOUNTER — Other Ambulatory Visit: Payer: Self-pay

## 2020-09-13 ENCOUNTER — Ambulatory Visit (INDEPENDENT_AMBULATORY_CARE_PROVIDER_SITE_OTHER): Payer: Medicare HMO | Admitting: Psychiatry

## 2020-09-13 DIAGNOSIS — F411 Generalized anxiety disorder: Secondary | ICD-10-CM | POA: Diagnosis not present

## 2020-09-13 DIAGNOSIS — F3341 Major depressive disorder, recurrent, in partial remission: Secondary | ICD-10-CM

## 2020-09-13 DIAGNOSIS — Z630 Problems in relationship with spouse or partner: Secondary | ICD-10-CM

## 2020-09-13 NOTE — Progress Notes (Signed)
Psychotherapy Progress Note [facility] Haley Czar, PhD LP  Patient ID: Haley Valdez     MRN: [id number] Therapy format: Individual psychotherapy Date: 09/13/2020      Start: 1:13p     Stop: 2:03p     Time Spent: 50 min Location: In-person   Session narrative (presenting needs, interim history, self-report of stressors and symptoms, applications of prior therapy, status changes, and interventions made in session) Haley Valdez moved out in the night, took her gun (daughter's) and his copy of keys with him.  2 weeks ago, son Haley Valdez, his Haley Valdez, and Haley Valdez all tested COVID+.  Sad, not devastated.  Worried now about the [place] trip, which she finally pinned him down to find out he did not intend to go.  He had already been sleeping on the couch since moving in, cagey about things, and intuition told her he was going to flake out.  Then cousin d. 2 wks ago, which entailed her making an overnight trip to pay respects, got lost coming back into town, blithely told Haley Valdez about it, got nothing back.  5:30am he opened her door and asked her what "really" happened, implying she was off cheating.  Insulted, angry, told him he needs to get out, he said he'd do it if/when he felt like it, and soon enough he did.  Relieved to have the anxiety down of what she will endure and whether she can count on him, relieved not to be worrying what he really feels/thinks, living out a stagnant relationship, and being the only who seemed to be trying.  Turns out ever since the March move-in he'd been trying to exert half-ownership in a condo lease that he neither paid nor signed.  Pattern had flared up of her expressing concern and him reacting to it as her telling him what to do.  Clear that it's no lonelier with him out than it was with him in.  Accepting of the likelihood they are finished.  Can get son Haley Valdez to be the courier for some possessions yet to return to him.  Is already set up to meet [facility] today.   Still clear about her boundaries, denies she feels desperate enough to turn that into staying over and getting sexually involved.  Still clear she will go to [place], now alone, possibly with daughter.  Affirmed and encouraged.  Therapeutic modalities: Cognitive Behavioral Therapy, Solution-Oriented/Positive Psychology, and Ego-Supportive  Mental Status/Observations:  Appearance:   Casual     Behavior:  Appropriate  Motor:  Normal  Speech/Language:   Clear and Coherent  Affect:  Appropriate  Mood:  normal and more settled  Thought process:  normal  Thought content:    WNL  Sensory/Perceptual disturbances:    WNL  Orientation:  Fully oriented  Attention:  Good    Concentration:  Good  Memory:  WNL  Insight:    Fair  Judgment:   Good  Impulse Control:  Fair   Risk Assessment: Danger to Self: No Self-injurious Behavior: No Danger to Others: No Physical Aggression / Violence: No Duty to Warn: No Access to Firearms a concern: No  Assessment of progress:  progressing  Diagnosis:   ICD-10-CM   1. Generalized anxiety disorder (r/o PTSD)  F41.1   2. Relationship problem between partners  Z63.0   3. Major depressive disorder, recurrent, in partial remission (HCC)  F33.41    Plan:  Referrals for charitable pickup of possessions she wants to donate and disposal of unwanted things Follow  through with returning possessions to their rightful owners Caution about Haley Valdez, don't get involved on rebound Other recommendations/advice as may be noted above Continue to utilize previously learned skills ad lib Maintain medication as prescribed and work faithfully with relevant prescriber(s) if any changes are desired or seem indicated Call the clinic on-call service, present to ER, or call 911 if any life-threatening psychiatric crisis Return for session(s) already scheduled. Already scheduled visit in this office 10/04/2020.  Haley Fries, PhD Haley Czar, PhD LP Clinical Psychologist, [facility] [address], [address] [place], [place] [address] [phone]

## 2020-10-04 ENCOUNTER — Other Ambulatory Visit: Payer: Self-pay

## 2020-10-04 ENCOUNTER — Ambulatory Visit (INDEPENDENT_AMBULATORY_CARE_PROVIDER_SITE_OTHER): Payer: Medicare HMO | Admitting: Psychiatry

## 2020-10-04 DIAGNOSIS — F331 Major depressive disorder, recurrent, moderate: Secondary | ICD-10-CM

## 2020-10-04 DIAGNOSIS — M48062 Spinal stenosis, lumbar region with neurogenic claudication: Secondary | ICD-10-CM

## 2020-10-04 DIAGNOSIS — Z7289 Other problems related to lifestyle: Secondary | ICD-10-CM

## 2020-10-04 DIAGNOSIS — Z630 Problems in relationship with spouse or partner: Secondary | ICD-10-CM

## 2020-10-04 DIAGNOSIS — F1090 Alcohol use, unspecified, uncomplicated: Secondary | ICD-10-CM

## 2020-10-04 DIAGNOSIS — F411 Generalized anxiety disorder: Secondary | ICD-10-CM | POA: Diagnosis not present

## 2020-10-04 DIAGNOSIS — G6181 Chronic inflammatory demyelinating polyneuritis: Secondary | ICD-10-CM
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
## 2020-10-17 ENCOUNTER — Encounter: Payer: Self-pay | Admitting: Neurology

## 2020-10-17 ENCOUNTER — Ambulatory Visit: Payer: Medicare HMO | Admitting: Neurology

## 2020-10-17 VITALS — BP 145/87 | HR 77 | Ht 67.0 in | Wt 153.5 lb

## 2020-10-17 DIAGNOSIS — G6181 Chronic inflammatory demyelinating polyneuritis: Secondary | ICD-10-CM | POA: Diagnosis not present

## 2020-10-17 DIAGNOSIS — M48062 Spinal stenosis, lumbar region with neurogenic claudication: Secondary | ICD-10-CM | POA: Diagnosis not present

## 2020-10-17 MED ORDER — GABAPENTIN 300 MG PO CAPS: 300.0000 mg | ORAL_CAPSULE | Freq: Three times a day (TID) | ORAL | 3 refills | Status: DC

## 2020-10-17 NOTE — Progress Notes (Signed)
ASSESSMENT AND PLAN 60 y.o. year old female  has a past medical history of ADHD, Chronic neck pain, Depression, Fibromyalgia, Hyperlipemia, Neuropathy, Raynaud's disease, and Thyroid disease. here with:  Chronic demyelinating polyradiculoneuropathy. -Overall stable, continues to benefit from IVIG in terms of muscle strength and gait  -Repeat EMG nerve conduction study on July 13, 2019, continue showed features of demyelinating neuropathy, evident by prolonged distal latency, slow conduction velocity.  Prolonged F-wave latency,  -Spinal fluid testing in 2019 showed total protein of 110, cytoalbumin dissociation, WBC of 0.   -She received her first round of IVIG on June 13, 14 2019, 2 g/kg for IV infusion, maintenance dose 1 g/kg through home infusion since, reported mild to moderate improvement   -The atypical features is well preserved reflexes of bilateral upper extremity and patella, there was no significant pathology on previous MRI of the brain, cervical, thoracic spine.  -Continue IVIG 1 g/kg, she is getting it through home health, every 3 to 4 weeks Worsening low back pain, radiating pain to bilateral lower extremity -MRI lumbar spine May 2021 showed severe stenosis at L4-5 Referred to neurosurgery again for decompression surgery  Neuropathic pain Continue Cymbalta 60 mg daily, previously she was on gabapentin which has helped her neuropathic pain, worry about the side effect of mental slowing, she has stopped taking gabapentin, she is going through a lot of stress, just separated from her boyfriend of 53 years old, tearful at today's clinical visit, restart gabapentin up to 900 mg daily, prefer higher dose at night to help her sleep,   DIAGNOSTIC DATA (LABS, IMAGING, TESTING) - I reviewed patient records, labs, notes, testing and imaging myself where available.  EMG/NCS in May 2021 This is an abnormal study.  There is evidence of chronic demyelinating polyradiculoneuropathy.   There is no evidence of axonal loss.  Compared to previous study in April 2019, there was mild to moderate improvement, as evident by increased multiple motor CMAP amplitude, improved F-wave latency, and appearance of bilateral lower extremity sensory responses.    MRI of the lumbar spine without contrast shows the following on May 8th 2021. 1.   At L4-L5, there is severe spinal stenosis due to anterolisthesis, disc protrusion, facet hypertrophy and ligamentum flavum hypertrophy.  There is moderately severe right lateral recess stenosis with probable right L5 nerve root compression.  Additionally, due to the severity of the spinal stenosis, other traversing nerve roots could be compressed. 2.   At L3-L4, there is mild to moderate spinal stenosis.  There is potential for left L4 nerve root compression. 3.   There are moderate degenerative changes at the other lumbar levels without spinal stenosis or nerve root compression..   Normal MRI thoracic spine (without) on October 29 2017   HISTORY OF PRESENT ILLNESS:  Haley Valdez is a 60 year old female, seen in refer by her primary care doctor [name].  for evaluation of transient ischemic attack, initial evaluation was on April 22, 2017.   I reviewed and summarized the referring note, she has past medical history of hypothyroidism, peripheral neuropathy, hyperlipidemia,   She works as a Copywriter, advertising for many years, around 1995, she began to experience neck pain, intermittent bilateral feet and hand paresthesia, began to seek neurological care, per patient, patient was diagnosed was carpal tunnel syndromes, peripheral neuropathy,   Then she began to develop unsteady gait,  began to fall since age 74,   She was also diagnosed with hepatitis C received treatment  around 2008, around that time, she complains of excessive fatigue, generalized weakness,   In 2015, with her abnormal neck posturing, persistent neck pain, she received  Botox injection in May 2015, responded very well, second injection in August 2015, has caused left neck pain, swollen, weakness, has to hold her head up with her hand   Since August 2015, she complains of frequent left-sided neck pain, radiating pain to left occipital, left parietal region, left side headaches,   She had extensive evaluations, I was able to review outside MRI cervical report October 2015, Left C3-4 facet abnormality is deep to the tender palpable  abnormality. The facet is overgrown due to degenerative change. In addition, there is bone marrow and adjacent soft tissue edema and enhancement suggesting active osteoarthritis. Underlying infection not excluded but considered less likely. Correlate with symptoms. Nevidence of discitis or abscess. There is cervical spondylosis. No other acute abnormality.  No mass or adenopathy detected.   MRI brian in Oct 2018: Mild nonspecific supratentorial small vessel disease, no contrast enhancement,   Repeat MRI in Cervcial in June 2018.  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, with severe left foraminal stenosis, multilevel degenerative disc disease, she is now referred to neurosurgeon, pain management for epidural injection   She continued complaints of intermittent bilateral hand and feet paresthesia, gait abnormality, but today's neurological examination are fairly normal,   Laboratory evaluations in October 2018, INR 0.9 normal CBC,   Update July 22, 2017: She is alone at today's clinical visit, pressed speech, volunteer a lot of informations, long history of ADHD, taking Adderall 10 mg daily, also on polypharmacy treatment, chronic methadone treatment 10 mg 3 times a day, also taking Ativan 1 mg every 8 hours, Cymbalta 60 mg daily, Neurontin 300 mg 3 times a day   She continue complains of neck pain, radiating pain to left shoulder, drop things from her left hand,    I was able to review the EMG nerve conduction study dated April 09, 2017 by Dr. [name], there was no evidence of left C5-6 radiculopathy, moderate carpal tunnel at the left wrist, moderate severe left ulnar neuropathy at the wrist, this is based on active neuropathic changes noted in the left deltoid, biceps, triceps, pronator teres, abductor pollicis brevis, and left cervical paraspinals,   The study showed moderately prolonged left median sensory peak latency, with severely prolonged left median motor distal latency, normal C map amplitude, mild slow conduction velocity.   She previously received the Botox injection for her neck pain, abnormal neck posturing in May again August 2015 by outside neurologist [name], [name], I was able to review injection note, 1. Bilateral Upper trapezius received 40 units each side 2. Splenius Capiti received 20 units each side 3. Longissimus 20 units each side 4. Semispinalis Capitis 20 units side   She reported since her injection in August 2015, she had a knot at left neck from injection, been persistent, continue have significant neck pain, frequent headaches,   We have personally reviewed MRI of the brain with and without contrast in October 2018: No acute abnormality, scattered subcortical hyperintense T2 signal changes, largest is at left anterior temporal lobe, no contrast enhancement, most consistent with small vessel disease.   MRI of cervical spine multilevel degenerative changes, left facet arthritis at C3-4 with severe left foraminal stenosis, there is no significant canal stenosis.   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  There is also suggestion of temporal dispersion at bilateral tibial proximal stimulation sites.   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 on October 08, 2017, decreased TSH, there was suggestion of her thyroid supplement change, the patient did not make any changes yet,   She continue has mood swings, pressed speech, complains of bilateral feet paresthesia, gait abnormality, urinary urgency, she did have significant hyperreflexia of bilateral patella but absent ankle reflexes, less dependent sensory changes, MRI of the brain and cervical findings would not explain her hyperreflexia, ordered MRI of thoracic spine   UPDATE November 26 2017: She had her first home IVIG infusion through [facility] on June 13, 14, a week after infusion, she reported doing well, however, about 3 weeks after the infusion, in early July 2019 she noticed a rash broke out at the bottom of her feet, arm, dry scaly skins, there is also raised erythematous dry scaly rash at bilateral leg, she reported similar reaction to interferon for hepatitis C treatment in the past, she was seen by her dermatologist Dr.Walter Elvera Lennox in recent few months, was given the diagnosis of eczema, given a prescription of dexamethasone cream.   Patient stated, [facility] home infusion agent is planning on switching from Octgam to different 10% IVIG privigen, she complains of excessive stress, depression, " she feel manner just want to stay in bed all the time"    IVIG infusion in June did help her some, she feel more energetic,  Less lower extremity spasticity, paresthesia,   MRI of thoracic spine on October 29, 2017 was normal    UPDATE Apr 22 2018: She continued her IVIG infusion, did notice less fatigue, lessening paresthesia and pain, she is going through a lot of stress, complains of worsening anxiety, she wished to continue IVIG treatment, but has to switch to a different provider in [facility] system for better coverage from her insurance company,   Update June 01, 2019: She has lost follow-up since last visit in December 2019 due to health insurance, she is on thyroid supplement now, she had a history of chronic diffuse body achy pain, fibromyalgia, was under pain management, also see psychiatrist, taking Cymbalta 60 mg daily, gabapentin 300 mg 3 times a day, methadone 10 mg every 8 hours, Adderall 10 mg daily   She has been receiving her IVIG through home health on a monthly basis, she continues to report improvement, continue has mild gait abnormality, especially when closing her eyes   She was diagnosed with hand and feet psoriasis,   Laboratory evaluation from [facility] system in September 2020, normal hemoglobin of 12.8, elevated triglyceride 509, total cholesterol 205, normal CMP, creatinine of 0.96, UDS was positive for methadone, amphetamine,   UPDATE July 13 2019: Patient has been receiving IVIG treatment on a monthly basis, she is doing very well, continue have mild improvement, but continue complains of bilateral upper and  lower extremity paresthesia, sometimes achy pain   She had a history of chronic low back pain, worsening since September 2020, now complains of frequent radiating pain to bilateral lower extremity, difficult to sit still, or standing for too long, also has urinary urgency   She return for electrodiagnostic study today, which continues show evidence of demyelinating polyradiculoneuropathy, but mild improvement compared to previous EMG study in April 2019     UPDATE October 13 2019: She continue to improve with IVIG treatment, she tolerated treatment well, with each IVIG infusion, she  felt the last bilateral upper and lower extremity paresthesia and neuropathic pain, not improved gait abnormality, she does complains of worsening low back pain, electrodiagnostic study in March 2021 showed evidence of chronic bilateral lumbosacral radiculopathy changes She also complains of urinary urgency, frequency, occasionally stress incontinence We personally reviewed MRI of lumbar spine in May 2021, evidence of severe spinal stenosis at L4-5 due to anterolisthesis, disc protrusion, facet hypertrophy and ligamentum flavum hypertrophy.  There is moderately severe right lateral recess stenosis with probable right L5 nerve root compression.  Additionally, due to the severity of the spinal stenosis, other traversing nerve roots could be compressed.; L3-L4, there is mild to moderate spinal stenosis.  There is potential for left L4 nerve root compression. There are moderate degenerative changes at the other lumbar levels without spinal stenosis or nerve root compression.  UPDATE June 15th 2022: Her bilateral feet lower extremity burning pain, discoloration has much improved with continued IVIG treatment, 1 g/kg every 3 to 4 weeks through home health,  The most bothersome symptom for her is worsening lower extremity pain, radiating pain to bilateral lower extremity, recent few months, she complains 24/7 10 out of 10 severe pain,  She is also going through a lot of stress, just separated from her boyfriend of 10 years, tearful during today's interview, taking Cymbalta 60 mg daily which is helpful, previously gabapentin was helpful, she worried about her difficulty concentrating, contributed to gabapentin, stopped gabapentin without advise, noticed worsening pain,  In addition, she complains of worsening urinary frequency, incontinence, was seen by neurosurgeon in the past, deemed to be a surgical candidate," I can no longer tolerate the pain, needs to have surgery now"  PHYSICAL EXAM  Vitals:    10/17/20 1116  BP: (!) 145/87  Pulse: 77  Weight: 153 lb 8 oz (69.6 kg)  Height: '5\' 7"'  (1.702 m)   Body mass index is 24.04 kg/m.  Generalized: Well developed, in no acute distress   PHYSICAL EXAMNIATION:  Gen: NAD, conversant, well nourised, well groomed                     Cardiovascular: Regular rate rhythm, no peripheral edema, warm, nontender. Eyes: Conjunctivae clear without exudates or hemorrhage Neck: Supple, no carotid bruits. Pulmonary: Clear to auscultation bilaterally   NEUROLOGICAL EXAM:  MENTAL STATUS: Speech:    Speech is normal; fluent and spontaneous with normal comprehension.  Cognition:     Orientation to time, place and person     Normal recent and remote memory     Normal Attention span and concentration     Normal Language, naming, repeating,spontaneous speech     Fund of knowledge   CRANIAL NERVES: CN II: Visual fields are full to confrontation.  Pupils are round equal and briskly reactive to light. CN III, IV, VI: extraocular movement are normal. No ptosis. CN V: Facial sensation is intact to pinprick in all 3 divisions bilaterally. Corneal  responses are intact.  CN VII: Face is symmetric with normal eye closure and smile. CN VIII: Hearing is normal to casual conversation CN IX, X: Palate elevates symmetrically. Phonation is normal. CN XI: Head turning and shoulder shrug are intact CN XII: Tongue is midline with normal movements and no atrophy.  MOTOR: There is no pronator drift of out-stretched arms. Muscle bulk and tone are normal. Muscle strength is normal.  REFLEXES: Reflexes are 2+ and symmetric at the biceps, triceps, knees, and ankles. Plantar responses are flexor.  SENSORY: Intact to light touch, pinprick, positional and vibratory sensation are intact in fingers and toes.  COORDINATION: Rapid alternating movements and fine finger movements are intact. There is no dysmetria on finger-to-nose and heel-knee-shin.    GAIT/STANCE: She  can get up from sitting position arm crossed, mildly antalgic due to pain,   REVIEW OF SYSTEMS: Out of a complete 14 system review of symptoms, the patient complains only of the following symptoms, and all other reviewed systems are negative.  Fatigue  ALLERGIES: Allergies  Allergen Reactions   Nitrofurantoin Itching    Redness in feet    Wasp Venom Protein Other (See Comments)    cellutlitis    HOME MEDICATIONS: Outpatient Medications Prior to Visit  Medication Sig Dispense Refill   amphetamine-dextroamphetamine (ADDERALL) 10 MG tablet Take 10 mg by mouth daily.     CALCIUM-MAGNESIUM-ZINC PO Take 1 tablet by mouth daily.     DULoxetine (CYMBALTA) 60 MG capsule Take 1 capsule (60 mg total) by mouth daily. 90 capsule 4   levothyroxine (SYNTHROID) 112 MCG tablet Take 112 mcg by mouth daily before breakfast.     liothyronine (CYTOMEL) 5 MCG tablet Take 2 tablets before breakfast and 1 tablet before dinner 270 tablet 0   LORazepam (ATIVAN) 1 MG tablet Take 1 mg by mouth every 8 (eight) hours.     traZODone (DESYREL) 50 MG tablet Take 50 mg by mouth at bedtime.     brexpiprazole (REXULTI) TABS tablet Take 0.25 mg by mouth daily.     gabapentin (NEURONTIN) 300 MG capsule Take 1 capsule (300 mg total) by mouth 3 (three) times daily. 90 capsule 11   methadone (DOLOPHINE) 10 MG tablet Take 10 mg by mouth every 8 (eight) hours.     No facility-administered medications prior to visit.    PAST MEDICAL HISTORY: Past Medical History:  Diagnosis Date   ADHD    Chronic neck pain    Depression    Fibromyalgia    Hyperlipemia    Neuropathy    Raynaud's disease    Thyroid disease     PAST SURGICAL HISTORY: Past Surgical History:  Procedure Laterality Date   arm surgery Right    Fracture   CESAREAN SECTION     LEG SURGERY Right    Fracture   TUBAL LIGATION      FAMILY HISTORY: Family History  Problem Relation Age of Onset   Heart attack Mother    Thyroid disease Mother     Stroke Father    Thyroid disease Sister     SOCIAL HISTORY: Social History   Socioeconomic History   Marital status: Married    Spouse name: Not on file   Number of children: 2   Years of education: 14   Highest education level: Associate degree: occupational, Hotel manager, or vocational program  Occupational History   Occupation: [employer]  Tobacco Use   Smoking status: Every Day    Packs/day: 1.00  Pack years: 0.00    Types: Cigarettes   Smokeless tobacco: Never  Vaping Use   Vaping Use: Never used  Substance and Sexual Activity   Alcohol use: Yes    Comment: occ   Drug use: No   Sexual activity: Not on file  Other Topics Concern   Not on file  Social History Narrative   Lives at home with her boyfriend.   Right-handed.   1 cup caffeine per day.   Social Determinants of Health   Financial Resource Strain: Not on file  Food Insecurity: Not on file  Transportation Needs: Not on file  Physical Activity: Not on file  Stress: Not on file  Social Connections: Not on file  Intimate Partner Violence: Not on file

## 2020-10-18 ENCOUNTER — Telehealth: Payer: Self-pay | Admitting: Neurology

## 2020-10-18 NOTE — Telephone Encounter (Signed)
Faxed referral to [facility]. Phone: [phone]. Fax: [phone]. They will call patient to schedule. 

## 2020-10-25 ENCOUNTER — Ambulatory Visit (INDEPENDENT_AMBULATORY_CARE_PROVIDER_SITE_OTHER): Payer: Medicare HMO | Admitting: Psychiatry

## 2020-10-25 ENCOUNTER — Other Ambulatory Visit: Payer: Self-pay

## 2020-10-25 DIAGNOSIS — M48062 Spinal stenosis, lumbar region with neurogenic claudication: Secondary | ICD-10-CM

## 2020-10-25 DIAGNOSIS — F411 Generalized anxiety disorder: Secondary | ICD-10-CM | POA: Diagnosis not present

## 2020-10-25 DIAGNOSIS — F331 Major depressive disorder, recurrent, moderate: Secondary | ICD-10-CM | POA: Diagnosis not present

## 2020-10-25 DIAGNOSIS — Z62820 Parent-biological child conflict: Secondary | ICD-10-CM

## 2020-10-25 NOTE — Progress Notes (Signed)
Psychotherapy Progress Note [facility] [name], PhD LP  Patient ID: Haley Valdez     MRN: [id number] Therapy format: Individual psychotherapy Date: 10/25/2020      Start: 1:12p     Stop: 2:00p     Time Spent: 48 min Location: In-person   Session narrative (presenting needs, interim history, self-report of stressors and symptoms, applications of prior therapy, status changes, and interventions made in session) Back pain escalated, will re-evaluate for spinal stenosis surgery originally recommended last summer.  Knows she would probably need inpatient rehab followup and/or home health coverage for recovery.  Does not feel she can count on her kids, feels both Haley Valdez and Haley Valdez have turned on her since Haley Valdez left.  Little evidence, just being quiet.  Ultrasound has found a cyst or something on her pancreas, CT tomorrow.  Tearful, worried.  Haley Valdez texts, but still feels she won't actually talk.  Challenged about the assumption she's unapproachable when Haley Valdez has agreed in text to go to a doctor visit if she can work around .  Is angry at them both, says neither have come to see her in two months, despite living only 20 min away.    Dating again, does feel OK about it, not rushing.  Kids got her to cancel the haul away service without any plan what to do about the storage costs.  Haley Valdez she's dating Haley Valdez) is making calls to help triage things for donation.  He has a good relationship with his son and communicates well, which is a help.  Both guys she's dated work reliably.    Discussed at some length responding to the kids, especially Haley Valdez, who seems to have a knack for negativity and conclusion-jumping, while Haley Valdez has knack for catastrophizing and trying to suffer silently with the rejection she believes she's getting.  Encouraged in recognizing when Haley Valdez is naysaying and resisting the urge to explain herself, redirecting to either ask Haley Valdez if she is up to hearing what it's like for [facility] or  else switching tactics and asking Haley Valdez to spell out how her idea works instead of seeming to argue (and inflame the idea that Haley Valdez is "being difficult").  Believes she can do this, but hard to feel up to calling her to risk it.  Affirmed that it won't come naturally, but it is her authority to decide what to do facing budget pressure, and if she wants to go on and dispose of belongings her own way, without trying to persuade her daughter out of an unrealistic insistence she try to market and sell belongings, it is absolutely her prerogative.  Therapeutic modalities: Cognitive Behavioral Therapy, Solution-Oriented/Positive Psychology, Ego-Supportive, and Assertiveness/Communication  Mental Status/Observations:  Appearance:   Casual     Behavior:  Appropriate  Motor:  Normal and pain  Speech/Language:   Clear and Coherent  Affect:  Appropriate and Tearful  Mood:  sad  Thought process:  concrete  Thought content:    WNL  Sensory/Perceptual disturbances:    WNL  Orientation:  Fully oriented  Attention:  Good    Concentration:  Good  Memory:  grossly intact  Insight:    Fair  Judgment:   Good  Impulse Control:  Fair   Risk Assessment: Danger to Self: No Self-injurious Behavior: No Danger to Others: No Physical Aggression / Violence: No Duty to Warn: No Access to Firearms a concern: No  Assessment of progress:  progressing  Diagnosis:   ICD-10-CM   1. Generalized anxiety disorder (r/o  PTSD)  F41.1     2. Major depressive disorder, recurrent episode, moderate (HCC)  F33.1     3. Spinal stenosis of lumbar region with neurogenic claudication  M48.062     4. Relationship problem between parent and child  Z62.820      Plan:  Try Haley Valdez again, to follow up on accompanying her to medical visit and how she intends it would work trying to hold and sell things instead of liquidating Try assertive questions more than explanations in dealing with differences of opinion with Haley Valdez Endorse  autonomy to decide how to dispose of excess belongings and cut storage costs Endorse autonomy to date, just self-monitor for signs of going in too fast, overcommitting, or setting up for unhealthy dependency Option to invite family member(s) for consultation Other recommendations/advice as may be noted above Continue to utilize previously learned skills ad lib Maintain medication as prescribed and work faithfully with relevant prescriber(s) if any changes are desired or seem indicated Call the clinic on-call service, present to ER, or call 911 if any life-threatening psychiatric crisis Return for session(s) already scheduled, available earlier @ PT's need. Already scheduled visit in this office 12/13/2020.  Robley Fries, PhD [name], PhD LP Clinical Psychologist, [facility] [address] [place], [place] [address] [phone]

## 2020-11-12 ENCOUNTER — Ambulatory Visit: Payer: Medicare HMO | Attending: Neurosurgery | Admitting: Physical Therapy

## 2020-11-12 DIAGNOSIS — M48062 Spinal stenosis, lumbar region with neurogenic claudication: Secondary | ICD-10-CM | POA: Insufficient documentation

## 2020-11-12 DIAGNOSIS — M5441 Lumbago with sciatica, right side: Secondary | ICD-10-CM | POA: Insufficient documentation

## 2020-11-12 DIAGNOSIS — M5442 Lumbago with sciatica, left side: Secondary | ICD-10-CM | POA: Insufficient documentation

## 2020-11-12 DIAGNOSIS — M62830 Muscle spasm of back: Secondary | ICD-10-CM | POA: Insufficient documentation

## 2020-11-14 ENCOUNTER — Other Ambulatory Visit: Payer: Self-pay

## 2020-11-14 ENCOUNTER — Ambulatory Visit: Payer: Medicare HMO | Admitting: Physical Therapy

## 2020-11-14 ENCOUNTER — Encounter: Payer: Self-pay | Admitting: Physical Therapy

## 2020-11-14 DIAGNOSIS — M48062 Spinal stenosis, lumbar region with neurogenic claudication: Secondary | ICD-10-CM | POA: Diagnosis not present

## 2020-11-14 DIAGNOSIS — M5442 Lumbago with sciatica, left side: Secondary | ICD-10-CM

## 2020-11-14 DIAGNOSIS — M5441 Lumbago with sciatica, right side: Secondary | ICD-10-CM

## 2020-11-14 DIAGNOSIS — M62830 Muscle spasm of back: Secondary | ICD-10-CM | POA: Diagnosis present

## 2020-11-14 NOTE — Therapy (Signed)
[facility] [place] [place] [address] [facility]. [place], [place], [address] Phone: [phone]   Fax:  [phone]  Physical Therapy Evaluation  Patient Details  Name: Haley Valdez MRN: [id number] Date of Birth: Nov 25, 1960 Referring Provider (PT): Sampson Goon   Encounter Date: 11/14/2020   PT End of Session - 11/14/20 1126     Visit Number 1    Number of Visits 12    Date for PT Re-Evaluation 01/15/21    Authorization Type [employer]    PT Start Time 1103    PT Stop Time 1150    PT Time Calculation (min) 47 min    Activity Tolerance Patient tolerated treatment well    Behavior During Therapy WFL for tasks assessed/performed             Past Medical History:  Diagnosis Date   ADHD    Chronic neck pain    Depression    Fibromyalgia    Hyperlipemia    Neuropathy    Raynaud's disease    Thyroid disease     Past Surgical History:  Procedure Laterality Date   arm surgery Right    Fracture   CESAREAN SECTION     LEG SURGERY Right    Fracture   TUBAL LIGATION      There were no vitals filed for this visit.    Subjective Assessment - 11/14/20 1106     Subjective Patient reports that she has been having LBP for a number of years she reports that she also has bilateral leg pain with LE weakness, reports difficulty with ADL's.  MRI showed severe DDD with stenosis with compression of nerve roots    Limitations Lifting;Walking;House hold activities    Patient Stated Goals have less pain    Currently in Pain? Yes    Pain Score 2     Pain Location Back    Pain Orientation Lower    Pain Descriptors / Indicators Aching    Pain Type Acute pain    Pain Radiating Towards has pain in both legs buttock and the posterior legs    Pain Onset More than a month ago    Pain Frequency Intermittent    Aggravating Factors  standing, bending, pain can be 9/10    Pain Relieving Factors stretches and movements pain can be 0/10    Effect of Pain on  Daily Activities limits all ADL's                [facility] PT Assessment - 11/14/20 0001       Assessment   Medical Diagnosis LBP with radiculopahty    Referring Provider (PT) Sampson Goon    Onset Date/Surgical Date 10/15/20    Prior Therapy no      Precautions   Precautions None      Balance Screen   Has the patient fallen in the past 6 months Yes    How many times? 2   reports legs give out     Home Environment   Additional Comments no stairs, some housework      Prior Function   Level of Independence Independent    Vocation On disability    Leisure stretches at home      Posture/Postural Control   Posture Comments fwd head rounded shoulders      ROM / Strength   AROM / PROM / Strength AROM;Strength      AROM   Overall AROM Comments Lumbar ROM decreased 25%  Strength   Overall Strength Comments 4/5 for the LE's      Flexibility   Soft Tissue Assessment /Muscle Length yes    Hamstrings verytight with pain in the legs at 50 degrees    Piriformis very tight      Palpation   Palpation comment she is very tight in the lumbar parapsinals, tender in the buttocks and into the LE's                        Objective measurements completed on examination: See above findings.       [facility] Adult PT Treatment/Exercise - 11/14/20 0001       Modalities   Modalities Traction      Traction   Type of Traction Lumbar    Max (lbs) 55    Hold Time static    Time 12                      PT Short Term Goals - 11/14/20 1127       PT SHORT TERM GOAL #1   Title independent with initial HEP    Time 3    Period Weeks    Status New               PT Long Term Goals - 11/14/20 1133       PT LONG TERM GOAL #1   Title understand posture and body mechanics    Time 8    Period Weeks    Status New      PT LONG TERM GOAL #2   Title independent with advanced HEP    Time 8    Period Weeks    Status New      PT LONG TERM  GOAL #3   Title decrease pain 50%    Time 8    Period Weeks    Status New      PT LONG TERM GOAL #4   Title increase SLR to 70 degrees    Time 8    Period Weeks    Status New                    Plan - 11/14/20 1128     Clinical Impression Statement Patient reports a few years of LBP and LE pain.  An MRI showed severe stenosis at L4-5 and moderate at L3-4, some nerve root compression.  She has some weakness in th ecore, tight Hs and piriformis.  Reports difficulty standing or sitting for long periods.  Very tight in the lumbar mms, tender in the buttocks and lateral thighs    Stability/Clinical Decision Making Stable/Uncomplicated    Clinical Decision Making Low    Rehab Potential Good    PT Frequency 2x / week    PT Duration 8 weeks    PT Treatment/Interventions ADLs/Self Care Home Management;Electrical Stimulation;Moist Heat;Traction;Functional mobility training;Therapeutic activities;Therapeutic exercise;Neuromuscular re-education;Manual techniques;Patient/family education;Dry needling    PT Next Visit Plan gfve HEP for stenosis, DDD and tight LE's    Consulted and Agree with Plan of Care Patient             Patient will benefit from skilled therapeutic intervention in order to improve the following deficits and impairments:  Decreased range of motion, Difficulty walking, Increased muscle spasms, Pain, Improper body mechanics, Impaired flexibility, Decreased strength, Postural dysfunction  Visit Diagnosis: Acute bilateral low back pain with bilateral sciatica - Plan: PT plan  of care cert/re-cert  Muscle spasm of back - Plan: PT plan of care cert/re-cert  Spinal stenosis of lumbar region with neurogenic claudication - Plan: PT plan of care cert/re-cert     Problem List Patient Active Problem List   Diagnosis Date Noted   Complaints of total body pain 10/14/2019   Spinal stenosis of lumbar region with neurogenic claudication 10/13/2019   Bilateral low back  pain with sciatica 07/13/2019   Gait abnormality 06/01/2019   CIDP (chronic inflammatory demyelinating polyneuropathy) (HCC) 08/07/2017   Paresthesia 04/22/2017   Neck pain 04/22/2017    Jearld Lesch., PT 11/14/2020, 11:43 AM  [facility] [place] [facility] [address] [facility]. [place], [place], [address] Phone: [phone]   Fax:  [phone]  Name: Haley Valdez MRN: [id number] Date of Birth: 26-Nov-1960

## 2020-11-19 ENCOUNTER — Ambulatory Visit: Payer: Medicare HMO

## 2020-11-20 ENCOUNTER — Other Ambulatory Visit: Payer: Self-pay

## 2020-11-20 ENCOUNTER — Encounter: Payer: Self-pay | Admitting: Physical Therapy

## 2020-11-20 ENCOUNTER — Ambulatory Visit: Payer: Medicare HMO | Admitting: Physical Therapy

## 2020-11-20 DIAGNOSIS — M48062 Spinal stenosis, lumbar region with neurogenic claudication: Secondary | ICD-10-CM

## 2020-11-20 DIAGNOSIS — M62830 Muscle spasm of back: Secondary | ICD-10-CM

## 2020-11-20 NOTE — Therapy (Signed)
[facility] [place] [facility] [address] [facility]. [place], [place], [address] Phone: [phone]   Fax:  [phone]  Physical Therapy Treatment  Patient Details  Name: Haley Valdez MRN: [id number] Date of Birth: 11-04-1960 Referring Provider (PT): Sampson Goon   Encounter Date: 11/20/2020   PT End of Session - 11/20/20 1337     Visit Number 2    Number of Visits 12    Date for PT Re-Evaluation 01/15/21    Authorization Type [employer]    PT Start Time 1300    PT Stop Time 1346    PT Time Calculation (min) 46 min    Activity Tolerance Patient tolerated treatment well    Behavior During Therapy WFL for tasks assessed/performed             Past Medical History:  Diagnosis Date   ADHD    Chronic neck pain    Depression    Fibromyalgia    Hyperlipemia    Neuropathy    Raynaud's disease    Thyroid disease     Past Surgical History:  Procedure Laterality Date   arm surgery Right    Fracture   CESAREAN SECTION     LEG SURGERY Right    Fracture   TUBAL LIGATION      There were no vitals filed for this visit.   Subjective Assessment - 11/20/20 1306     Subjective Traction did help, doing ok, having pain in the legs    Currently in Pain? Yes    Pain Score 8     Pain Location Leg    Pain Orientation Left;Right                               [facility] Adult PT Treatment/Exercise - 11/20/20 0001       Exercises   Exercises Lumbar      Lumbar Exercises: Stretches   Active Hamstring Stretch Left;Right;3 reps;10 seconds    Piriformis Stretch Right;Left;3 reps;10 seconds      Lumbar Exercises: Aerobic   UBE (Upper Arm Bike) L1.6 x 2 min each    Recumbent Bike L2.5 x 4 min      Lumbar Exercises: Machines for Strengthening   Other Lumbar Machine Exercise Row & Lats 20lb 2x10      Lumbar Exercises: Seated   Sit to Stand 5 reps   x2     Lumbar Exercises: Supine   Bridge Compliant;10 reps;2 seconds    Other  Supine Lumbar Exercises hooklying march x10      Modalities   Modalities Traction      Traction   Type of Traction Lumbar    Max (lbs) 75    Hold Time static    Time 12                      PT Short Term Goals - 11/14/20 1127       PT SHORT TERM GOAL #1   Title independent with initial HEP    Time 3    Period Weeks    Status New               PT Long Term Goals - 11/14/20 1133       PT LONG TERM GOAL #1   Title understand posture and body mechanics    Time 8    Period Weeks    Status New  PT LONG TERM GOAL #2   Title independent with advanced HEP    Time 8    Period Weeks    Status New      PT LONG TERM GOAL #3   Title decrease pain 50%    Time 8    Period Weeks    Status New      PT LONG TERM GOAL #4   Title increase SLR to 70 degrees    Time 8    Period Weeks    Status New                   Plan - 11/20/20 1337     Clinical Impression Statement Pt enters clinic reporting increase bilateral LE pain. Attempted sit to stands with yellow ball but had to discontinue the ball due to increase low back pain. Tactile cues to keep shoulders down and for core engagement needed with seated rows. Some increase in back discomfort reported with supine bridges. Positive response to LE stretches. Increased th pull with lumbar traction.    Stability/Clinical Decision Making Stable/Uncomplicated    Rehab Potential Good    PT Frequency 2x / week    PT Duration 8 weeks    PT Treatment/Interventions ADLs/Self Care Home Management;Electrical Stimulation;Moist Heat;Traction;Functional mobility training;Therapeutic activities;Therapeutic exercise;Neuromuscular re-education;Manual techniques;Patient/family education;Dry needling    PT Next Visit Plan Assess Traction increase, postural and core strength             Patient will benefit from skilled therapeutic intervention in order to improve the following deficits and impairments:  Decreased  range of motion, Difficulty walking, Increased muscle spasms, Pain, Improper body mechanics, Impaired flexibility, Decreased strength, Postural dysfunction  Visit Diagnosis: Muscle spasm of back  Spinal stenosis of lumbar region with neurogenic claudication     Problem List Patient Active Problem List   Diagnosis Date Noted   Complaints of total body pain 10/14/2019   Spinal stenosis of lumbar region with neurogenic claudication 10/13/2019   Bilateral low back pain with sciatica 07/13/2019   Gait abnormality 06/01/2019   CIDP (chronic inflammatory demyelinating polyneuropathy) (HCC) 08/07/2017   Paresthesia 04/22/2017   Neck pain 04/22/2017    Grayce Sessions, PTA 11/20/2020, 1:41 PM  [facility] [place] [facility] [address]. [place], [place], [address] Phone: [phone]   Fax:  [phone]  Name: Haley Valdez MRN: [id number] Date of Birth: [birth year]

## 2020-11-20 NOTE — Patient Instructions (Signed)
Access Code: [email or web address] URL: [email or web address] Date: 11/20/2020 Prepared by: Debroah Baller  Exercises Supine Bridge - 1 x daily - 7 x weekly - 3 sets - 10 reps Supine Hamstring Stretch with Strap - 1 x daily - 7 x weekly - 3 sets - 10 reps Supine March - 1 x daily - 7 x weekly - 3 sets - 10 reps Supine Figure 4 Piriformis Stretch - 1 x daily - 7 x weekly - 3 sets - 10 reps

## 2020-11-22 ENCOUNTER — Other Ambulatory Visit: Payer: Self-pay

## 2020-11-22 ENCOUNTER — Ambulatory Visit: Payer: Medicare HMO | Admitting: Physical Therapy

## 2020-11-22 ENCOUNTER — Encounter: Payer: Self-pay | Admitting: Physical Therapy

## 2020-11-22 DIAGNOSIS — M62830 Muscle spasm of back: Secondary | ICD-10-CM

## 2020-11-22 DIAGNOSIS — M48062 Spinal stenosis, lumbar region with neurogenic claudication: Secondary | ICD-10-CM | POA: Diagnosis not present

## 2020-11-22 DIAGNOSIS — M5441 Lumbago with sciatica, right side: Secondary | ICD-10-CM

## 2020-11-22 DIAGNOSIS — M5442 Lumbago with sciatica, left side: Secondary | ICD-10-CM

## 2020-11-22 NOTE — Therapy (Signed)
[place]. [place], [place], [address] Phone: [phone]   Fax:  [phone]  Physical Therapy Treatment  Patient Details  Name: Haley Valdez MRN: [id number] Date of Birth: [birth year] Referring Provider (PT): Lollie Marrow   Encounter Date: 11/22/2020   PT End of Session - 11/22/20 1413     Visit Number 3    Number of Visits 12    Date for PT Re-Evaluation 01/15/21    Authorization Type [employer]    PT Start Time 2094    PT Stop Time 1428    PT Time Calculation (min) 43 min    Activity Tolerance Patient tolerated treatment well    Behavior During Therapy WFL for tasks assessed/performed             Past Medical History:  Diagnosis Date   ADHD    Chronic neck pain    Depression    Fibromyalgia    Hyperlipemia    Neuropathy    Raynaud's disease    Thyroid disease     Past Surgical History:  Procedure Laterality Date   arm surgery Right    Fracture   CESAREAN SECTION     LEG SURGERY Right    Fracture   TUBAL LIGATION      There were no vitals filed for this visit.   Subjective Assessment - 11/22/20 1346     Subjective Had some soreness yesterday in the piriformis area, today she feel fine    Currently in Pain? Yes    Pain Score 4     Pain Location Back                               [facility] Adult PT Treatment/Exercise - 11/22/20 0001       Lumbar Exercises: Aerobic   Nustep L3 x 6 min      Lumbar Exercises: Standing   Row Strengthening;Both;20 reps;Power Community education officer Strengthening;Power Tower;Both;20 reps    Shoulder Extension Limitations 5    Other Standing Lumbar Exercises Hip Ext red 2x10 each      Lumbar Exercises: Seated   Sit to Stand 10 reps   x2     Modalities   Modalities Traction      Traction   Type of Traction Lumbar    Max (lbs) 80    Hold Time static    Time 12                      PT  Short Term Goals - 11/22/20 1416       PT SHORT TERM GOAL #1   Title independent with initial HEP    Status Partially Met               PT Long Term Goals - 11/22/20 1416       PT LONG TERM GOAL #1   Title understand posture and body mechanics    Status On-going      PT LONG TERM GOAL #2   Title independent with advanced HEP    Status Partially Met                   Plan - 11/22/20 1413     Clinical Impression Statement Pt reports some soreness in L piriformis from stretching that has resolved. She did well overall today  reporting less pain while increasing her activity. Tactile cues for posture needed with both standing rows and extensions. No reports of pressure reported with sit to stands. Cues to hold contraction with standing hip Ext.    Stability/Clinical Decision Making Stable/Uncomplicated    Rehab Potential Good    PT Frequency 2x / week    PT Duration 8 weeks    PT Treatment/Interventions ADLs/Self Care Home Management;Electrical Stimulation;Moist Heat;Traction;Functional mobility training;Therapeutic activities;Therapeutic exercise;Neuromuscular re-education;Manual techniques;Patient/family education;Dry needling    PT Next Visit Plan Assess Traction increase, postural and core strength             Patient will benefit from skilled therapeutic intervention in order to improve the following deficits and impairments:  Decreased range of motion, Difficulty walking, Increased muscle spasms, Pain, Improper body mechanics, Impaired flexibility, Decreased strength, Postural dysfunction  Visit Diagnosis: Spinal stenosis of lumbar region with neurogenic claudication  Muscle spasm of back  Acute bilateral low back pain with bilateral sciatica     Problem List Patient Active Problem List   Diagnosis Date Noted   Complaints of total body pain 10/14/2019   Spinal stenosis of lumbar region with neurogenic claudication 10/13/2019   Bilateral low back  pain with sciatica 07/13/2019   Gait abnormality 06/01/2019   CIDP (chronic inflammatory demyelinating polyneuropathy) ([place]) 08/07/2017   Paresthesia 04/22/2017   Neck pain 04/22/2017    Scot Jun, PTA 11/22/2020, 2:17 PM  [place]. [place], [place], [address] Phone: [phone]   Fax:  [phone]  Name: Emmelia Holdsworth MRN: [id number] Date of Birth: 1960/09/21

## 2020-11-27 ENCOUNTER — Ambulatory Visit: Payer: Medicare HMO | Admitting: Physical Therapy

## 2020-11-29 ENCOUNTER — Ambulatory Visit: Payer: Medicare HMO | Admitting: Physical Therapy

## 2020-12-04 ENCOUNTER — Encounter: Payer: Self-pay | Admitting: Physical Therapy

## 2020-12-04 ENCOUNTER — Ambulatory Visit: Payer: Medicare HMO | Attending: Neurosurgery | Admitting: Physical Therapy

## 2020-12-04 ENCOUNTER — Other Ambulatory Visit: Payer: Self-pay

## 2020-12-04 DIAGNOSIS — M5442 Lumbago with sciatica, left side: Secondary | ICD-10-CM | POA: Insufficient documentation

## 2020-12-04 DIAGNOSIS — M62830 Muscle spasm of back: Secondary | ICD-10-CM | POA: Insufficient documentation

## 2020-12-04 DIAGNOSIS — M48062 Spinal stenosis, lumbar region with neurogenic claudication: Secondary | ICD-10-CM | POA: Diagnosis present

## 2020-12-04 DIAGNOSIS — M5441 Lumbago with sciatica, right side: Secondary | ICD-10-CM | POA: Insufficient documentation

## 2020-12-04 NOTE — Therapy (Signed)
[place]. [place], [place], [address] Phone: [phone]   Fax:  [phone]  Physical Therapy Treatment  Patient Details  Name: Haley Valdez MRN: [id number] Date of Birth: Sep 23, 1960 Referring Provider (PT): Lollie Marrow   Encounter Date: 12/04/2020   PT End of Session - 12/04/20 1430     Visit Number 4    Number of Visits 12    Date for PT Re-Evaluation 01/15/21    Authorization Type [employer]    PT Start Time 4315    PT Stop Time 1440    PT Time Calculation (min) 45 min    Activity Tolerance Patient tolerated treatment well    Behavior During Therapy WFL for tasks assessed/performed             Past Medical History:  Diagnosis Date   ADHD    Chronic neck pain    Depression    Fibromyalgia    Hyperlipemia    Neuropathy    Raynaud's disease    Thyroid disease     Past Surgical History:  Procedure Laterality Date   arm surgery Right    Fracture   CESAREAN SECTION     LEG SURGERY Right    Fracture   TUBAL LIGATION      There were no vitals filed for this visit.   Subjective Assessment - 12/04/20 1409     Subjective I still just really hurt, the pain comes and goes, sometimes I really have diffiuclty walking    Currently in Pain? Yes    Pain Score 5     Pain Location Back    Pain Orientation Left    Aggravating Factors  walking, coughing                               [facility] Adult PT Treatment/Exercise - 12/04/20 0001       Lumbar Exercises: Stretches   Active Hamstring Stretch Left;Right;3 reps;10 seconds    Piriformis Stretch Right;Left;3 reps;20 seconds      Lumbar Exercises: Aerobic   Nustep L3 x 6 min      Lumbar Exercises: Supine   Other Supine Lumbar Exercises feet on ball K2C, trunk rotation, small bridges for posterior activation and isometric abs      Traction   Type of Traction Lumbar    Max (lbs) 75    Hold Time static    Time 12                       PT Short Term Goals - 11/22/20 1416       PT SHORT TERM GOAL #1   Title independent with initial HEP    Status Partially Met               PT Long Term Goals - 12/04/20 1432       PT LONG TERM GOAL #1   Title understand posture and body mechanics    Status On-going      PT LONG TERM GOAL #2   Title independent with advanced HEP    Status Partially Met      PT LONG TERM GOAL #3   Title decrease pain 50%    Status On-going                   Plan - 12/04/20 1431     Clinical Impression Statement  went over the need for flexibility, core strength and then how to do things with less strain in her body to prevent flareups and reinjury, she had questions about surgery and we talked some about this and having her ask her MD about plans and outcomes.  She has cramps in the calves and HS with bridges and at times with stretches    PT Next Visit Plan workk on posture and body mechanics    Consulted and Agree with Plan of Care Patient             Patient will benefit from skilled therapeutic intervention in order to improve the following deficits and impairments:  Decreased range of motion, Difficulty walking, Increased muscle spasms, Pain, Improper body mechanics, Impaired flexibility, Decreased strength, Postural dysfunction  Visit Diagnosis: Muscle spasm of back  Acute bilateral low back pain with bilateral sciatica  Spinal stenosis of lumbar region with neurogenic claudication     Problem List Patient Active Problem List   Diagnosis Date Noted   Complaints of total body pain 10/14/2019   Spinal stenosis of lumbar region with neurogenic claudication 10/13/2019   Bilateral low back pain with sciatica 07/13/2019   Gait abnormality 06/01/2019   CIDP (chronic inflammatory demyelinating polyneuropathy) ([place]) 08/07/2017   Paresthesia 04/22/2017   Neck pain 04/22/2017    Sumner Boast., PT 12/04/2020, 2:33 PM  [place]. [place], [place], [address] Phone: [phone]   Fax:  [phone]  Name: Nena Hampe MRN: [id number] Date of Birth: 1961-01-05

## 2020-12-06 ENCOUNTER — Ambulatory Visit: Payer: Medicare HMO | Admitting: Physical Therapy

## 2020-12-11 ENCOUNTER — Ambulatory Visit: Payer: Medicare HMO | Admitting: Physical Therapy

## 2020-12-11 ENCOUNTER — Encounter: Payer: Self-pay | Admitting: Physical Therapy

## 2020-12-11 ENCOUNTER — Other Ambulatory Visit: Payer: Self-pay

## 2020-12-11 DIAGNOSIS — M62830 Muscle spasm of back: Secondary | ICD-10-CM

## 2020-12-11 DIAGNOSIS — M48062 Spinal stenosis, lumbar region with neurogenic claudication: Secondary | ICD-10-CM

## 2020-12-11 DIAGNOSIS — M5441 Lumbago with sciatica, right side: Secondary | ICD-10-CM

## 2020-12-11 NOTE — Therapy (Signed)
[place]. [place], [place], [address] Phone: [phone]   Fax:  [phone]  Physical Therapy Treatment  Patient Details  Name: Haley Valdez MRN: [id number] Date of Birth: 01-Jan-1961 Referring Provider (PT): Lollie Marrow   Encounter Date: 12/11/2020   PT End of Session - 12/11/20 1404     Visit Number 5    Number of Visits 12    Date for PT Re-Evaluation 01/15/21    Authorization Type [employer]    PT Start Time 1245    PT Stop Time 1333    PT Time Calculation (min) 48 min    Activity Tolerance Patient tolerated treatment well    Behavior During Therapy WFL for tasks assessed/performed             Past Medical History:  Diagnosis Date   ADHD    Chronic neck pain    Depression    Fibromyalgia    Hyperlipemia    Neuropathy    Raynaud's disease    Thyroid disease     Past Surgical History:  Procedure Laterality Date   arm surgery Right    Fracture   CESAREAN SECTION     LEG SURGERY Right    Fracture   TUBAL LIGATION      There were no vitals filed for this visit.   Subjective Assessment - 12/11/20 1258     Subjective Patient has an appointment with the MD next week.  Reports that [place] the [place] and she did well while on the boat and not having much pain    Currently in Pain? Yes    Pain Score 2     Pain Location Back    Pain Descriptors / Indicators Aching    Pain Relieving Factors the traction helps                               [facility] Adult PT Treatment/Exercise - 12/11/20 0001       Lumbar Exercises: Stretches   Passive Hamstring Stretch Right;Left;3 reps;10 seconds    Piriformis Stretch Right;Left;3 reps;20 seconds      Lumbar Exercises: Aerobic   Nustep L5 x 6 min      Lumbar Exercises: Machines for Strengthening   Other Lumbar Machine Exercise Row & Lats 20lb 2x10      Lumbar Exercises: Standing   Other Standing Lumbar Exercises Hip Ext  red 2x10 each      Lumbar Exercises: Supine   Other Supine Lumbar Exercises feet on ball K2C, trunk rotation, small bridges for posterior activation and isometric abs      Traction   Type of Traction Lumbar    Max (lbs) 80    Hold Time static    Time 14                      PT Short Term Goals - 11/22/20 1416       PT SHORT TERM GOAL #1   Title independent with initial HEP    Status Partially Met               PT Long Term Goals - 12/11/20 1406       PT LONG TERM GOAL #1   Title understand posture and body mechanics    Status Partially Met      PT LONG TERM GOAL #2  Title independent with advanced HEP    Status Partially Met                   Plan - 12/11/20 1405     Clinical Impression Statement Patient reports that she went baoting over the weekend, and did well, reports some less pain and that she will see an MD next week.  She feels like she is getting stronger and moving better, has quesitons about HEP.    PT Next Visit Plan workk on posture and body mechanics, continue strength    Consulted and Agree with Plan of Care Patient             Patient will benefit from skilled therapeutic intervention in order to improve the following deficits and impairments:  Decreased range of motion, Difficulty walking, Increased muscle spasms, Pain, Improper body mechanics, Impaired flexibility, Decreased strength, Postural dysfunction  Visit Diagnosis: Muscle spasm of back  Acute bilateral low back pain with bilateral sciatica  Spinal stenosis of lumbar region with neurogenic claudication     Problem List Patient Active Problem List   Diagnosis Date Noted   Complaints of total body pain 10/14/2019   Spinal stenosis of lumbar region with neurogenic claudication 10/13/2019   Bilateral low back pain with sciatica 07/13/2019   Gait abnormality 06/01/2019   CIDP (chronic inflammatory demyelinating polyneuropathy) ([place]) 08/07/2017    Paresthesia 04/22/2017   Neck pain 04/22/2017    Sumner Boast., PT 12/11/2020, 2:07 PM  [place]. [place], [place], [address] Phone: [phone]   Fax:  [phone]  Name: Haley Valdez MRN: [id number] Date of Birth: [birth year]

## 2020-12-13 ENCOUNTER — Other Ambulatory Visit: Payer: Self-pay

## 2020-12-13 ENCOUNTER — Ambulatory Visit: Payer: Medicare HMO | Admitting: Physical Therapy

## 2020-12-13 ENCOUNTER — Encounter: Payer: Self-pay | Admitting: Physical Therapy

## 2020-12-13 ENCOUNTER — Ambulatory Visit: Payer: Medicare HMO | Admitting: Psychiatry

## 2020-12-13 DIAGNOSIS — M48062 Spinal stenosis, lumbar region with neurogenic claudication: Secondary | ICD-10-CM

## 2020-12-13 DIAGNOSIS — Z638 Other specified problems related to primary support group: Secondary | ICD-10-CM

## 2020-12-13 DIAGNOSIS — F331 Major depressive disorder, recurrent, moderate: Secondary | ICD-10-CM

## 2020-12-13 DIAGNOSIS — M62830 Muscle spasm of back: Secondary | ICD-10-CM | POA: Diagnosis not present

## 2020-12-13 DIAGNOSIS — Z62820 Parent-biological child conflict: Secondary | ICD-10-CM | POA: Diagnosis not present

## 2020-12-13 DIAGNOSIS — F411 Generalized anxiety disorder: Secondary | ICD-10-CM

## 2020-12-13 DIAGNOSIS — M5442 Lumbago with sciatica, left side: Secondary | ICD-10-CM

## 2020-12-13 DIAGNOSIS — M5441 Lumbago with sciatica, right side: Secondary | ICD-10-CM

## 2020-12-13 DIAGNOSIS — G6181 Chronic inflammatory demyelinating polyneuritis: Secondary | ICD-10-CM

## 2020-12-13 DIAGNOSIS — Z87898 Personal history of other specified conditions: Secondary | ICD-10-CM

## 2020-12-13 NOTE — Progress Notes (Addendum)
Psychotherapy Progress Note [facility] [name], PhD LP  Patient ID: Haley Valdez     MRN: [id number] Therapy format: Individual psychotherapy Date: 12/13/2020      Start: 1:15p     Stop: 2:04p     Time Spent: 49 min Location: In-person   Session narrative (presenting needs, interim history, self-report of stressors and symptoms, applications of prior therapy, status changes, and interventions made in session) Son Amalia Valdez was not able to sell things as hoped, and D [name] paid storage but never helped deal with the stuff, it ended up turning into Naoma's expense, and some conflict turned into [name] blocking and disowning her.  Add to it how aunt Gigi Gin, [place], recently "found it disgusting" that Jensyn talked about having raised her own kids.  There was time after divorce when both kids were in care of other relatives, Renne was in D&A rehab, getting away from their addicted father.  No clue why A Gigi Gin would say it, and the kids were 3 & 7 when they reunited in one household on a stable basis thereafter.  Now, at 60yo, [name] is saying Marikay's sister Karen Kitchens was more of a mother than Divinity, feeling gravely hurt.  Confesses she got baited into an argument and tried to guilt [name] about not paying good enough attention to her grandfather, a back story in which [name] seemed to be callous toward his health but probably was ignorant that he had a head injury and a stroke.  [name] upped the ante by telling her mother she's hated by the whole family, etc.  Now Maya is blocked, and grandson doesn't answer her direct calls, either.  Historical intrigue with sister Karen Kitchens in trying to be in business together, then Morgan giving her back her money to cancel the business relationship.  2015 spotted some texts on [name]'s phone showing advice from [place] to the effect of urging her not to have anything to do with Corrie Dandy b/c she has BPD (dx'd by [employer] therapist in [place]).  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  Now, even a cousin Jahniyah consulted told her she should go to rehab, for alcoholism, but she hardly drinks at all, and only takes lorazepam at night.  Former BF [name] also apparently talked up the idea that she was self-medicating, even suicidal, and gave her gun to her son.  Supported in reaction to loss of rapport and confidence and learning of family rumor and judgment.  Normalized feelings and validated how various family members could jump to conclusions based on some characteristics, but obviously were more interested in grinding axes than fact-checking.  Advocated for noncombative responses to irrational anger and not getting baited into having to prove her innocence.  Discussed at some length how to tolerate -- and understand -- [name]'s shutdown.  Has a boyfriend now, Montserrat.  Seems considerate, kind.  Encouraged caution.  Now looking to have back surgery.  Is cleared but wants second opinion on her cardiovascular readiness.  Encouraged also consider what her personal support system would be for recovery.  Therapeutic modalities: Cognitive Behavioral Therapy, Solution-Oriented/Positive Psychology, and Ego-Supportive  Mental Status/Observations:  Appearance:   Casual     Behavior:  Appropriate  Motor:  Normal  Speech/Language:   Clear and Coherent  Affect:  Appropriate  Mood:  sad  Thought process:  normal  Thought content:  WNL and worry  Sensory/Perceptual disturbances:    WNL  Orientation:  Fully oriented  Attention:  Good    Concentration:  Good  Memory:  WNL  Insight:    Fair  Judgment:   Good  Impulse Control:  Good   Risk Assessment: Danger to Self: No Self-injurious Behavior: No Danger to Others: No Physical Aggression / Violence: No Duty to Warn: No Access to Firearms a concern: No  Assessment of progress:  progressing  Diagnosis:    ICD-10-CM   1. Generalized anxiety disorder (r/o PTSD)  F41.1     2. Major depressive disorder, recurrent episode, moderate (HCC)  F33.1     3. Relationship problem between parent and child  Z62.820     4. Other family conflict  Z63.8     5. History of alcohol use disorder  Z87.898     6. Spinal stenosis of lumbar region with neurogenic claudication  M48.062     7. CIDP (chronic inflammatory demyelinating polyneuropathy) (HCC)  G61.81      Plan:  Practice noncombative response to family criticism.  Prepare an acknowledgment, if interested, about [name]'s anger, but finish up any perceived obligation to help or pay for anything.  Remember [name] is on a mood-altering substance herself and apt to project. Maintain open, honest dealings with Amalia Valdez Other recommendations/advice as may be noted above Continue to utilize previously learned skills ad lib Maintain medication as prescribed and work faithfully with relevant prescriber(s) if any changes are desired or seem indicated Call the clinic on-call service, present to ER, or call 911 if any life-threatening psychiatric crisis Return for time as available. Already scheduled visit in this office Visit date not found.  Robley Fries, PhD [name], PhD LP Clinical Psychologist, [facility] [address], [address] [place], [place] [address] [phone]

## 2020-12-13 NOTE — Therapy (Signed)
[place]. [place], [place], [address] Phone: [phone]   Fax:  [phone]  Physical Therapy Treatment  Patient Details  Name: Haley Valdez MRN: [id number] Date of Birth: 04-Mar-1961 Referring Provider (PT): Lollie Marrow   Encounter Date: 12/13/2020   PT End of Session - 12/13/20 1609     Visit Number 6    Number of Visits 12    Date for PT Re-Evaluation 01/15/21    Authorization Type [employer]    PT Start Time 1529    PT Stop Time 1623    PT Time Calculation (min) 54 min    Activity Tolerance Patient tolerated treatment well    Behavior During Therapy WFL for tasks assessed/performed             Past Medical History:  Diagnosis Date   ADHD    Chronic neck pain    Depression    Fibromyalgia    Hyperlipemia    Neuropathy    Raynaud's disease    Thyroid disease     Past Surgical History:  Procedure Laterality Date   arm surgery Right    Fracture   CESAREAN SECTION     LEG SURGERY Right    Fracture   TUBAL LIGATION      There were no vitals filed for this visit.   Subjective Assessment - 12/13/20 1533     Subjective Patient reports that she woke up with pain in the back, pain high 9/10    Currently in Pain? Yes    Pain Score 3     Pain Location Back    Pain Orientation Left    Pain Descriptors / Indicators Aching;Sore    Aggravating Factors  woke up with pain this AM                               [facility] Adult PT Treatment/Exercise - 12/13/20 0001       Lumbar Exercises: Aerobic   Nustep L5 x 7 min      Lumbar Exercises: Machines for Strengthening   Cybex Lumbar Extension 10# 2x10    Cybex Knee Extension 25# 2x10    Cybex Knee Flexion 20# 2x10    Other Lumbar Machine Exercise 15# AR press    Other Lumbar Machine Exercise Row & Lats 20lb 2x10      Lumbar Exercises: Supine   Other Supine Lumbar Exercises feet on ball K2C, trunk rotation, small bridges for  posterior activation and isometric abs      Traction   Type of Traction Lumbar    Max (lbs) 80    Hold Time static    Time 14                      PT Short Term Goals - 11/22/20 1416       PT SHORT TERM GOAL #1   Title independent with initial HEP    Status Partially Met               PT Long Term Goals - 12/11/20 1406       PT LONG TERM GOAL #1   Title understand posture and body mechanics    Status Partially Met      PT LONG TERM GOAL #2   Title independent with advanced HEP    Status Partially Met  Plan - 12/13/20 1610     Clinical Impression Statement Patient having a little more pain today.  I added more LE and core strength activities, needed cues to work on the core and hold stable.  She will be having an MD visit next week    PT Next Visit Plan workk on posture and body mechanics, continue strength    Consulted and Agree with Plan of Care Patient             Patient will benefit from skilled therapeutic intervention in order to improve the following deficits and impairments:  Decreased range of motion, Difficulty walking, Increased muscle spasms, Pain, Improper body mechanics, Impaired flexibility, Decreased strength, Postural dysfunction  Visit Diagnosis: Muscle spasm of back  Acute bilateral low back pain with bilateral sciatica  Spinal stenosis of lumbar region with neurogenic claudication     Problem List Patient Active Problem List   Diagnosis Date Noted   Complaints of total body pain 10/14/2019   Spinal stenosis of lumbar region with neurogenic claudication 10/13/2019   Bilateral low back pain with sciatica 07/13/2019   Gait abnormality 06/01/2019   CIDP (chronic inflammatory demyelinating polyneuropathy) ([place]) 08/07/2017   Paresthesia 04/22/2017   Neck pain 04/22/2017    Sumner Boast., PT 12/13/2020, 4:13 PM  [place]. [place], [place], [address] Phone: [phone]   Fax:  [phone]  Name: Haley Valdez MRN: [id number] Date of Birth: 1960-11-17

## 2020-12-18 ENCOUNTER — Other Ambulatory Visit: Payer: Self-pay

## 2020-12-18 ENCOUNTER — Ambulatory Visit: Payer: Medicare HMO | Admitting: Physical Therapy

## 2020-12-18 ENCOUNTER — Encounter: Payer: Medicare HMO | Admitting: Physical Therapy

## 2020-12-18 ENCOUNTER — Encounter: Payer: Self-pay | Admitting: Physical Therapy

## 2020-12-18 DIAGNOSIS — M48062 Spinal stenosis, lumbar region with neurogenic claudication: Secondary | ICD-10-CM

## 2020-12-18 DIAGNOSIS — M62830 Muscle spasm of back: Secondary | ICD-10-CM | POA: Diagnosis not present

## 2020-12-18 DIAGNOSIS — M5441 Lumbago with sciatica, right side: Secondary | ICD-10-CM

## 2020-12-18 NOTE — Therapy (Signed)
[place]. [place], [place], [address] Phone: [phone]   Fax:  [phone]  Physical Therapy Treatment  Patient Details  Name: Haley Valdez MRN: [id number] Date of Birth: [birth year] Referring Provider (PT): Lollie Marrow   Encounter Date: 12/18/2020   PT End of Session - 12/18/20 1407     Visit Number 7    Date for PT Re-Evaluation 01/15/21    PT Start Time 1345    PT Stop Time 1420    PT Time Calculation (min) 35 min    Activity Tolerance Patient tolerated treatment well    Behavior During Therapy WFL for tasks assessed/performed             Past Medical History:  Diagnosis Date   ADHD    Chronic neck pain    Depression    Fibromyalgia    Hyperlipemia    Neuropathy    Raynaud's disease    Thyroid disease     Past Surgical History:  Procedure Laterality Date   arm surgery Right    Fracture   CESAREAN SECTION     LEG SURGERY Right    Fracture   TUBAL LIGATION      There were no vitals filed for this visit.   Subjective Assessment - 12/18/20 1349     Subjective Have a second opinion today 3 regarding back surgery. Pt reports not wanting to do much. Pain down the L leg    Currently in Pain? Yes    Pain Score 7     Pain Location Leg    Pain Orientation Left                               [facility] Adult PT Treatment/Exercise - 12/18/20 0001       Lumbar Exercises: Aerobic   Nustep L5 x 7 min      Lumbar Exercises: Machines for Strengthening   Other Lumbar Machine Exercise Row & Lats 20lb 2x10      Traction   Type of Traction Lumbar    Max (lbs) 85    Hold Time static    Time 12                      PT Short Term Goals - 11/22/20 1416       PT SHORT TERM GOAL #1   Title independent with initial HEP    Status Partially Met               PT Long Term Goals - 12/11/20 1406       PT LONG TERM GOAL #1   Title understand posture and  body mechanics    Status Partially Met      PT LONG TERM GOAL #2   Title independent with advanced HEP    Status Partially Met                   Plan - 12/18/20 1408     Clinical Impression Statement Pt enters clinic reporting that she wanted to keep therapy to a minium because she has a MD appointment this afternoon regarding second opinion to her back surgery. Tactile cue for posture needed with rows and lat pull downs. Increased traction pull without issue.    Stability/Clinical Decision Making Stable/Uncomplicated    Rehab Potential Good    PT Frequency 2x / week  PT Duration 8 weeks    PT Treatment/Interventions ADLs/Self Care Home Management;Electrical Stimulation;Moist Heat;Traction;Functional mobility training;Therapeutic activities;Therapeutic exercise;Neuromuscular re-education;Manual techniques;Patient/family education;Dry needling    PT Next Visit Plan work on posture and body mechanics, continue strength             Patient will benefit from skilled therapeutic intervention in order to improve the following deficits and impairments:  Decreased range of motion, Difficulty walking, Increased muscle spasms, Pain, Improper body mechanics, Impaired flexibility, Decreased strength, Postural dysfunction  Visit Diagnosis: Muscle spasm of back  Acute bilateral low back pain with bilateral sciatica  Spinal stenosis of lumbar region with neurogenic claudication     Problem List Patient Active Problem List   Diagnosis Date Noted   Complaints of total body pain 10/14/2019   Spinal stenosis of lumbar region with neurogenic claudication 10/13/2019   Bilateral low back pain with sciatica 07/13/2019   Gait abnormality 06/01/2019   CIDP (chronic inflammatory demyelinating polyneuropathy) ([place]) 08/07/2017   Paresthesia 04/22/2017   Neck pain 04/22/2017    Scot Jun, PTA 12/18/2020, 2:10 PM  [place]. [place], [place], [address] Phone: [phone]   Fax:  [phone]  Name: Myleah Cavendish MRN: [id number] Date of Birth: 07-10-1960

## 2020-12-19 ENCOUNTER — Other Ambulatory Visit: Payer: Self-pay | Admitting: Neurosurgery

## 2020-12-20 ENCOUNTER — Encounter: Payer: Self-pay | Admitting: Physical Therapy

## 2020-12-20 ENCOUNTER — Ambulatory Visit: Payer: Medicare HMO | Admitting: Physical Therapy

## 2020-12-20 ENCOUNTER — Other Ambulatory Visit: Payer: Self-pay

## 2020-12-20 DIAGNOSIS — M62830 Muscle spasm of back: Secondary | ICD-10-CM

## 2020-12-20 DIAGNOSIS — M48062 Spinal stenosis, lumbar region with neurogenic claudication: Secondary | ICD-10-CM

## 2020-12-20 DIAGNOSIS — M5442 Lumbago with sciatica, left side: Secondary | ICD-10-CM

## 2020-12-20 NOTE — Therapy (Signed)
[place]. [place], [place], [address] Phone: [phone]   Fax:  [phone]  Physical Therapy Treatment  Patient Details  Name: Haley Valdez MRN: [id number] Date of Birth: [birth year] Referring Provider (PT): Lollie Marrow   Encounter Date: 12/20/2020   PT End of Session - 12/20/20 1549     Visit Number 8    Number of Visits 12    Date for PT Re-Evaluation 01/15/21    Authorization Type [employer]    PT Start Time 9450    PT Stop Time 1600    PT Time Calculation (min) 45 min    Activity Tolerance Patient tolerated treatment well    Behavior During Therapy WFL for tasks assessed/performed             Past Medical History:  Diagnosis Date   ADHD    Chronic neck pain    Depression    Fibromyalgia    Hyperlipemia    Neuropathy    Raynaud's disease    Thyroid disease     Past Surgical History:  Procedure Laterality Date   arm surgery Right    Fracture   CESAREAN SECTION     LEG SURGERY Right    Fracture   TUBAL LIGATION      There were no vitals filed for this visit.   Subjective Assessment - 12/20/20 1515     Subjective "Like shit" Surgery is scheduled for 9/15    Currently in Pain? No/denies                               [facility] Adult PT Treatment/Exercise - 12/20/20 0001       Lumbar Exercises: Aerobic   Nustep L5 x 7 min      Lumbar Exercises: Machines for Strengthening   Cybex Lumbar Extension 10# 2x10    Cybex Knee Extension 25# 2x10    Cybex Knee Flexion 20# 2x10    Other Lumbar Machine Exercise 15# AR press    Other Lumbar Machine Exercise Row & Lats 25lb 2x10      Lumbar Exercises: Standing   Shoulder Extension Strengthening;Power Tower;20 reps;Both    Shoulder Extension Limitations 10      Traction   Type of Traction Lumbar    Max (lbs) 85    Hold Time static    Time 12                      PT Short Term Goals - 12/20/20 1549        PT SHORT TERM GOAL #1   Title independent with initial HEP    Status Achieved               PT Long Term Goals - 12/20/20 1549       PT LONG TERM GOAL #1   Title understand posture and body mechanics    Status Partially Met      PT LONG TERM GOAL #2   Status Partially Met      PT LONG TERM GOAL #3   Title decrease pain 50%    Status On-going      PT LONG TERM GOAL #4   Title increase SLR to 70 degrees    Status On-going                   Plan - 12/20/20 1550  Clinical Impression Statement Pt enters clinic reporting that she is scheduled for surgery next mont abd today will be her last visit. She reports having a "[employer]" membership and well be going trying to get strong before surgery. She did well overall today with good ROM and strength with the interventions. Some weakness present with anti rotational presses, Tactile cue needed to maintain upright posture with standing shoulder extensions.    Stability/Clinical Decision Making Stable/Uncomplicated    Rehab Potential Good    PT Frequency 2x / week    PT Duration 8 weeks    PT Treatment/Interventions ADLs/Self Care Home Management;Electrical Stimulation;Moist Heat;Traction;Functional mobility training;Therapeutic activities;Therapeutic exercise;Neuromuscular re-education;Manual techniques;Patient/family education;Dry needling    PT Next Visit Plan D/C PT             Patient will benefit from skilled therapeutic intervention in order to improve the following deficits and impairments:  Decreased range of motion, Difficulty walking, Increased muscle spasms, Pain, Improper body mechanics, Impaired flexibility, Decreased strength, Postural dysfunction  Visit Diagnosis: Acute bilateral low back pain with bilateral sciatica  Spinal stenosis of lumbar region with neurogenic claudication  Muscle spasm of back     Problem List Patient Active Problem List   Diagnosis Date Noted   Complaints of total body  pain 10/14/2019   Spinal stenosis of lumbar region with neurogenic claudication 10/13/2019   Bilateral low back pain with sciatica 07/13/2019   Gait abnormality 06/01/2019   CIDP (chronic inflammatory demyelinating polyneuropathy) ([place]) 08/07/2017   Paresthesia 04/22/2017   Neck pain 04/22/2017   PHYSICAL THERAPY DISCHARGE SUMMARY  Visits from Start of Care: 8 Patient agrees to discharge. Patient goals were not met. Patient is being discharged due to the patient's request.   Scot Jun, PTA 12/20/2020, 3:53 PM  [place]. [place], [place], [address] Phone: [phone]   Fax:  [phone]  Name: Haley Valdez MRN: [id number] Date of Birth: 02-04-1961

## 2020-12-28 ENCOUNTER — Ambulatory Visit: Payer: Medicare HMO | Admitting: Psychiatry

## 2020-12-28 ENCOUNTER — Other Ambulatory Visit: Payer: Self-pay

## 2020-12-28 DIAGNOSIS — Z62820 Parent-biological child conflict: Secondary | ICD-10-CM

## 2020-12-28 DIAGNOSIS — F411 Generalized anxiety disorder: Secondary | ICD-10-CM | POA: Diagnosis not present

## 2020-12-28 DIAGNOSIS — Z638 Other specified problems related to primary support group: Secondary | ICD-10-CM

## 2020-12-28 DIAGNOSIS — F3341 Major depressive disorder, recurrent, in partial remission: Secondary | ICD-10-CM | POA: Diagnosis not present

## 2020-12-28 DIAGNOSIS — Z87898 Personal history of other specified conditions: Secondary | ICD-10-CM

## 2020-12-28 DIAGNOSIS — M48062 Spinal stenosis, lumbar region with neurogenic claudication: Secondary | ICD-10-CM

## 2020-12-28 DIAGNOSIS — G6181 Chronic inflammatory demyelinating polyneuritis: Secondary | ICD-10-CM

## 2020-12-28 NOTE — Progress Notes (Signed)
Psychotherapy Progress Note [facility] [name], PhD LP  Patient ID: Haley Valdez     MRN: [id number] Therapy format: Individual psychotherapy Date: 12/28/2020      Start: 11:16a     Stop: 12:06p     Time Spent: 50 min Location: In-person   Session narrative (presenting needs, interim history, self-report of stressors and symptoms, applications of prior therapy, status changes, and interventions made in session) Moved up appt to have her IVIG infusion early next week and then spinal stenosis surgery will be Sept 15.  Intimidated about that, but has brought up with surgeon that she has no one to stand by and started the conversation about rehab stay or home health coverage.  Has started conversation with 3-4 neighbors about checking on her, and [name] will have the dog.  Affirmed plans made.  Found out [name] lied to her about being separated, came to light when he had a missing person report on him initiated by his wife in [place], and police knocked on her door for having followed leads that he was seen with her.  Promptly gave him notice she won't be involved unless and until he is free and clear.  Still wounded by sister Bobbie's messaging and D [name]'s rejection, but trying her best to just let it lie.  Knows she can't defend herself without digging resentment deeper, or facing more wrath.  Reinforced self-support in the midst of it and being ready to answer if [name] reaches out, but otherwise show self-sufficiency and try to let her get curious.    Therapeutic modalities: Cognitive Behavioral Therapy, Solution-Oriented/Positive Psychology, and Ego-Supportive  Mental Status/Observations:  Appearance:   Casual     Behavior:  Appropriate  Motor:  Normal  Speech/Language:   Clear and Coherent  Affect:  Appropriate  Mood:  anxious  Thought process:  normal  Thought content:    WNL  Sensory/Perceptual disturbances:    WNL  Orientation:  Fully oriented  Attention:  Good     Concentration:  Fair  Memory:  WNL  Insight:    Good  Judgment:   Good  Impulse Control:  Fair   Risk Assessment: Danger to Self: No Self-injurious Behavior: No Danger to Others: No Physical Aggression / Violence: No Duty to Warn: No Access to Firearms a concern: No  Assessment of progress:  progressing  Diagnosis:   ICD-10-CM   1. Generalized anxiety disorder (r/o PTSD)  F41.1     2. Relationship problem between parent and child  Z62.820     3. Major depressive disorder, recurrent, in partial remission (HCC)  F33.41     4. Other family conflict  Z63.8     5. History of alcohol use disorder  Z87.898     6. Spinal stenosis of lumbar region with neurogenic claudication  M48.062     7. CIDP (chronic inflammatory demyelinating polyneuropathy) (HCC)  G61.81      Plan:  Follow through setting up support system for post-surgery Self-support re. [name].  Option Nar-Anon for support dealing with addicted family member(s). Caution in dating Other recommendations/advice as may be noted above Continue to utilize previously learned skills ad lib Maintain medication as prescribed and work faithfully with relevant prescriber(s) if any changes are desired or seem indicated Call the clinic on-call service, present to ER, or call 911 if any life-threatening psychiatric crisis Return after surgery, for OK to RS 9/19 or make video session. Already scheduled visit in this office 01/21/2021.  Robley Fries,  PhD Luan Moore, PhD LP Clinical Psychologist, [place] [facility] [address] [place], [place] [address] [phone]

## 2020-12-31 ENCOUNTER — Ambulatory Visit: Payer: Medicare HMO | Admitting: Psychiatry

## 2020-12-31 ENCOUNTER — Other Ambulatory Visit: Payer: Self-pay | Admitting: Neurosurgery

## 2021-01-02 NOTE — Pre-Procedure Instructions (Signed)
Surgical Instructions    Your procedure is scheduled on Thursday, September 15th, 2022.  Report to [facility] Main Entrance "A" at 05:30 A.M., then check in with the Admitting office.  Call this number if you have problems the morning of surgery:  [phone]   If you have any questions prior to your surgery date call [phone]: Open Monday-Friday 8am-4pm    Remember:  Do not eat or drink after midnight the night before your surgery   Take these medicines the morning of surgery with A SIP OF WATER: atorvastatin (LIPITOR) DULoxetine (CYMBALTA) levothyroxine (SYNTHROID) liothyronine (CYTOMEL)  If needed: LORazepam (ATIVAN)  Polyethylene Glycol 400 (BLINK TEARS OP)   As of today, STOP taking any Aspirin (unless otherwise instructed by your surgeon) Aleve, Naproxen, Ibuprofen, Motrin, Advil, Goody's, BC's, all herbal medications, fish oil, and all vitamins.          Do not wear jewelry or makeup Do not wear lotions, powders, perfumes, or deodorant. Do not shave 48 hours prior to surgery.   Do not bring valuables to the hospital. DO Not wear nail polish, gel polish, artificial nails, or any other type of covering on  natural nails including finger and toenails. If patients have artificial nails, gel coating, etc. that need to be removed by a nail salon please have this removed prior to surgery or surgery may need to be canceled/delayed if the surgeon/ anesthesia feels like the patient is unable to be adequately monitored.             [place] is not responsible for any belongings or valuables.  Do NOT Smoke (Tobacco/Vaping) or drink Alcohol 24 hours prior to your procedure If you use a CPAP at night, you may bring all equipment for your overnight stay.   Contacts, glasses, dentures or bridgework may not be worn into surgery, please bring cases for these belongings   For patients admitted to the hospital, discharge time will be determined by your treatment team.   Patients  discharged the day of surgery will not be allowed to drive home, and someone needs to stay with them for 24 hours.  ONLY 1 SUPPORT PERSON MAY BE PRESENT WHILE YOU ARE IN SURGERY. IF YOU ARE TO BE ADMITTED ONCE YOU ARE IN YOUR ROOM YOU WILL BE ALLOWED TWO (2) VISITORS.  Minor children may have two parents present. Special consideration for safety and communication needs will be reviewed on a case by case basis.  Special instructions:    Oral Hygiene is also important to reduce your risk of infection.  Remember - BRUSH YOUR TEETH THE MORNING OF SURGERY WITH YOUR REGULAR TOOTHPASTE   - Preparing For Surgery  Before surgery, you can play an important role. Because skin is not sterile, your skin needs to be as free of germs as possible. You can reduce the number of germs on your skin by washing with CHG (chlorahexidine gluconate) Soap before surgery.  CHG is an antiseptic cleaner which kills germs and bonds with the skin to continue killing germs even after washing.     Please do not use if you have an allergy to CHG or antibacterial soaps. If your skin becomes reddened/irritated stop using the CHG.  Do not shave (including legs and underarms) for at least 48 hours prior to first CHG shower. It is OK to shave your face.  Please follow these instructions carefully.     Shower the NIGHT BEFORE SURGERY and the MORNING OF SURGERY with CHG Soap.  If you chose to wash your hair, wash your hair first as usual with your normal shampoo. After you shampoo, rinse your hair and body thoroughly to remove the shampoo.  Then [employer] and genitals (private parts) with your normal soap and rinse thoroughly to remove soap.  After that Use CHG Soap as you would any other liquid soap. You can apply CHG directly to the skin and wash gently with a scrungie or a clean washcloth.   Apply the CHG Soap to your body ONLY FROM THE NECK DOWN.  Do not use on open wounds or open sores. Avoid contact with your  eyes, ears, mouth and genitals (private parts). Wash Face and genitals (private parts)  with your normal soap.   Wash thoroughly, paying special attention to the area where your surgery will be performed.  Thoroughly rinse your body with warm water from the neck down.  DO NOT shower/wash with your normal soap after using and rinsing off the CHG Soap.  Pat yourself dry with a CLEAN TOWEL.  Wear CLEAN PAJAMAS to bed the night before surgery  Place CLEAN SHEETS on your bed the night before your surgery  DO NOT SLEEP WITH PETS.   Day of Surgery:  Take a shower with CHG soap. Wear Clean/Comfortable clothing the morning of surgery Do not apply any deodorants/lotions.   Remember to brush your teeth WITH YOUR REGULAR TOOTHPASTE.   Please read over the following fact sheets that you were given.

## 2021-01-03 ENCOUNTER — Inpatient Hospital Stay (HOSPITAL_COMMUNITY)
Admission: RE | Admit: 2021-01-03 | Discharge: 2021-01-03 | Disposition: A | Discharge status: Home / Self Care | Payer: Medicare HMO | Source: Ambulatory Visit

## 2021-01-08 NOTE — Pre-Procedure Instructions (Addendum)
Surgical Instructions    Your procedure is scheduled on Thursday, September 15th.  Report to [facility] Main Entrance "A" at 5:30 A.M., then check in with the Admitting office.  Call this number if you have problems the morning of surgery:  [phone]   If you have any questions prior to your surgery date call [phone]: Open Monday-Friday 8am-4pm    Remember:  Do not eat or drink after midnight the night before your surgery    Take these medicines the morning of surgery with A SIP OF WATER  atorvastatin (LIPITOR) DULoxetine (CYMBALTA) levothyroxine (SYNTHROID)  liothyronine (CYTOMEL)    Take these medications as needed LORazepam (ATIVAN)  Eye drops  As of Thursday (01/10/21), STOP taking any Aspirin (unless otherwise instructed by your surgeon) Aleve, Naproxen, Ibuprofen, Motrin, Advil, Goody's, BC's, all herbal medications, fish oil, and all vitamins.                     Do NOT Smoke (Tobacco/Vaping) or drink Alcohol 24 hours prior to your procedure.  If you use a CPAP at night, you may bring all equipment for your overnight stay.   Contacts, glasses, piercing's, hearing aid's, dentures or partials may not be worn into surgery, please bring cases for these belongings.    For patients admitted to the hospital, discharge time will be determined by your treatment team.   Patients discharged the day of surgery will not be allowed to drive home, and someone needs to stay with them for 24 hours.  ONLY 1 SUPPORT PERSON MAY BE PRESENT WHILE YOU ARE IN SURGERY. IF YOU ARE TO BE ADMITTED ONCE YOU ARE IN YOUR ROOM YOU WILL BE ALLOWED TWO (2) VISITORS.  Minor children may have two parents present. Special consideration for safety and communication needs will be reviewed on a case by case basis.   Special instructions:   [place]- Preparing For Surgery  Before surgery, you can play an important role. Because skin is not sterile, your skin needs to be as free of germs as  possible. You can reduce the number of germs on your skin by washing with CHG (chlorahexidine gluconate) Soap before surgery.  CHG is an antiseptic cleaner which kills germs and bonds with the skin to continue killing germs even after washing.    Oral Hygiene is also important to reduce your risk of infection.  Remember - BRUSH YOUR TEETH THE MORNING OF SURGERY WITH YOUR REGULAR TOOTHPASTE  Please do not use if you have an allergy to CHG or antibacterial soaps. If your skin becomes reddened/irritated stop using the CHG.  Do not shave (including legs and underarms) for at least 48 hours prior to first CHG shower. It is OK to shave your face.  Please follow these instructions carefully.   Shower the NIGHT BEFORE SURGERY and the MORNING OF SURGERY  If you chose to wash your hair, wash your hair first as usual with your normal shampoo.  After you shampoo, rinse your hair and body thoroughly to remove the shampoo.  Use CHG Soap as you would any other liquid soap. You can apply CHG directly to the skin and wash gently with a scrungie or a clean washcloth.   Apply the CHG Soap to your body ONLY FROM THE NECK DOWN.  Do not use on open wounds or open sores. Avoid contact with your eyes, ears, mouth and genitals (private parts). Wash Face and genitals (private parts)  with your normal soap.   Wash  thoroughly, paying special attention to the area where your surgery will be performed.  Thoroughly rinse your body with warm water from the neck down.  DO NOT shower/wash with your normal soap after using and rinsing off the CHG Soap.  Pat yourself dry with a CLEAN TOWEL.  Wear CLEAN PAJAMAS to bed the night before surgery  Place CLEAN SHEETS on your bed the night before your surgery  DO NOT SLEEP WITH PETS.   Day of Surgery: Shower with CHG soap. Do not wear jewelry, make up, nail polish, gel polish, artificial nails, or any other type of covering on natural nails including finger and  toenails. If patients have artificial nails, gel coating, etc. that need to be removed by a nail salon please have this removed prior to surgery. Surgery may need to be canceled/delayed if the surgeon/ anesthesia feels like the patient is unable to be adequately monitored. Do not wear lotions, powders, perfumes, or deodorant. Do not shave 48 hours prior to surgery.   Do not bring valuables to the hospital. [facility] is not responsible for any belongings or valuables. Wear Clean/Comfortable clothing the morning of surgery Remember to brush your teeth WITH YOUR REGULAR TOOTHPASTE.   Please read over the following fact sheets that you were given.

## 2021-01-09 ENCOUNTER — Encounter (HOSPITAL_COMMUNITY): Payer: Self-pay

## 2021-01-09 ENCOUNTER — Other Ambulatory Visit: Payer: Self-pay

## 2021-01-09 ENCOUNTER — Encounter (HOSPITAL_COMMUNITY)
Admission: RE | Admit: 2021-01-09 | Discharge: 2021-01-09 | Disposition: A | Discharge status: Home / Self Care | Payer: Medicare HMO | Source: Ambulatory Visit | Attending: Neurosurgery | Admitting: Neurosurgery

## 2021-01-09 DIAGNOSIS — Z01818 Encounter for other preprocedural examination: Secondary | ICD-10-CM | POA: Diagnosis present

## 2021-01-09 LAB — CBC
HCT: 44.6 % (ref 36.0–46.0)
Hemoglobin: 14.4 g/dL (ref 12.0–15.0)
MCH: 32.5 pg (ref 26.0–34.0)
MCHC: 32.3 g/dL (ref 30.0–36.0)
MCV: 100.7 fL — ABNORMAL HIGH (ref 80.0–100.0)
Platelets: 161 10*3/uL (ref 150–400)
RBC: 4.43 MIL/uL (ref 3.87–5.11)
RDW: 13.1 % (ref 11.5–15.5)
WBC: 6.9 10*3/uL (ref 4.0–10.5)
nRBC: 0 % (ref 0.0–0.2)

## 2021-01-09 LAB — COMPREHENSIVE METABOLIC PANEL
ALT: 27 U/L (ref 0–44)
AST: 43 U/L — ABNORMAL HIGH (ref 15–41)
Albumin: 3.6 g/dL (ref 3.5–5.0)
Alkaline Phosphatase: 38 U/L (ref 38–126)
Anion gap: 6 (ref 5–15)
BUN: 10 mg/dL (ref 8–23)
CO2: 29 mmol/L (ref 22–32)
Calcium: 9.4 mg/dL (ref 8.9–10.3)
Chloride: 101 mmol/L (ref 98–111)
Creatinine, Ser: 0.97 mg/dL (ref 0.44–1.00)
GFR, Estimated: >60 mL/min (ref >60)
Glucose, Bld: 95 mg/dL (ref 70–99)
Potassium: 4.8 mmol/L (ref 3.5–5.1)
Sodium: 136 mmol/L (ref 135–145)
Total Bilirubin: 0.6 mg/dL (ref 0.3–1.2)
Total Protein: 8.8 g/dL — ABNORMAL HIGH (ref 6.5–8.1)

## 2021-01-09 LAB — TYPE AND SCREEN
ABO/RH(D): O POS
Antibody Screen: NEGATIVE

## 2021-01-09 LAB — SURGICAL PCR SCREEN
MRSA, PCR: POSITIVE — AB
Staphylococcus aureus: POSITIVE — AB

## 2021-01-09 NOTE — Progress Notes (Signed)
PCP - Dr. Dennis Bast Cardiologist - denies  PPM/ICD - denies   Chest x-ray - 2022 per pt, records requested from Dr. Einar Grad office EKG - 01/09/21 at PAT appt Stress Test - 12/06/20 ECHO - 12/06/20 Cardiac Cath - denies  Sleep Study - 2009 or 2010 per pt, no diagnosis of sleep apnea per pt   DM: denies  Blood Thinner Instructions: n/a Aspirin Instructions: n/a  ERAS Protcol - no   COVID TEST- scheduled for 9/13   Anesthesia review: yes, records requested  Patient denies shortness of breath, fever, cough and chest pain at PAT appointment   All instructions explained to the patient, with a verbal understanding of the material. Patient agrees to go over the instructions while at home for a better understanding.  The opportunity to ask questions was provided.

## 2021-01-10 ENCOUNTER — Encounter (HOSPITAL_COMMUNITY): Payer: Self-pay

## 2021-01-10 NOTE — Progress Notes (Signed)
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  History includes smoking, HTN, HLD, Raynaud's disease, fibromyalgia, chronic neck pain, neuropathy (chronic demyelinating polyradiculoneuropathy, s/p IVIG), ADHD, hepatitis C (2008), hypothyroidism, anxiety. 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  She had aged advanced coronary artery atherosclerosis on lung cancer screening CT in March 2022. She had a negative stress echo on 12/06/20.   Last PCP visit with Dr. Luiz Iron 01/08/21. He is aware of surgery plans.  Amlodipine 2.5 mg started for HTN.  HLD controlled on atorvastatin.  Still smoking.  Future PFT considered.  60-month follow-up planned.  Preoperative COVID-19 testing is scheduled for 01/15/2021.  Anesthesia team to evaluate on the day of surgery.   VS: BP (!) 158/83   Pulse 70   Temp 36.9 C (Oral)   Resp 18   Ht 5\' 7"  (1.702 m)   Wt 72.3 kg   SpO2 100%   BMI 24.98 kg/m    PROVIDERS: ., MD is PCP [facility] - [facility], MD is endocrinologist [facility]). Last visit 08/06/20.  10/06/20, MD is neurologist. Last visit 10/17/20. Chronic demyelinating polyradiculoneuropathy overall stable then.  Receiving IVIG 1 g/kg every 3 to 4 weeks per home health.  09/2019 MRI showed severe stenosis at L4-5. Referred to  neurosurgery for can sideration of surgery.   LABS: Labs reviewed: Acceptable for surgery. (all labs ordered are listed, but only abnormal results are displayed)  Labs Reviewed  SURGICAL PCR SCREEN - Abnormal; Notable for the following components:      Result Value   MRSA, PCR POSITIVE (*)    Staphylococcus aureus POSITIVE (*)    All other components within normal limits  CBC - Abnormal; Notable for the following components:   MCV 100.7 (*)    All other components within normal limits  COMPREHENSIVE METABOLIC PANEL - Abnormal; Notable for the following components:   Total Protein 8.8 (*)    AST 43 (*)    All other components within normal limits  TYPE AND SCREEN     IMAGES: CT Abd/pelvis 10/26/20: IMPRESSION:  - Minimally dilated pancreatic duct extending to the ampullary,  suspect related to incomplete pancreas divisum. Pancreatic duct  appears stable compared to 2015. No visualized pancreatic mass.  - Chronic celiac origin occlusion with reconstitution celiac branches  via the SMA to pancreaticoduodenal arcade collateral pathway.  - Left renal cortical atrophy.  - Small fat containing umbilical hernia, unchanged  - Minimal scattered diverticulosis without acute inflammatory process  - Aortic Atherosclerosis (ICD10-I70.0).    CT Lung cancer screen 07/30/20 ([facility]): IMPRESSION:  1. Lung-RADS 2, benign appearance or behavior. Continue annual  screening with low-dose chest CT without contrast in 12 months.  2. Aortic Atherosclerosis (ICD10-I70.0) and Emphysema (ICD10-J43.9).  3. Age advanced coronary artery atherosclerosis. Recommend  assessment of coronary risk factors and consideration of medical  therapy.  MRI L-spine 09/10/19: IMPRESSION: 1.   At L4-L5, there is severe spinal stenosis due to anterolisthesis, disc protrusion, facet hypertrophy and ligamentum flavum hypertrophy.  There is moderately severe right lateral recess stenosis with probable right L5 nerve  root compression.  Additionally, due to the severity of the spinal stenosis, other traversing nerve roots could be compressed. 2.   At L3-L4, there is mild to moderate spinal stenosis.  There is potential for left L4 nerve root compression. 3.   There are moderate degenerative changes at the other lumbar levels without spinal stenosis or nerve root compression..    EKG: 01/09/21:  Sinus bradycardia at 58 bpm Biatrial enlargement Abnormal ECG Confirmed by Tonny Bollman [phone]) on 01/09/2021 3:00:38 PM   CV: Exercise stress echo 12/06/20 ([facility]): SUMMARY  The patient had no chest pain.  The patient achieved 93 % of maximum predicted heart rate.  The METS achieved was 10.  Exercise capacity was good.  No diagnostic ST segment changes were seen.  Normal left ventricular function at rest.  Normal left ventricular function and global wall motion with stress.  Negative exercise echocardiography for inducible ischemia at target  heart rate.   Echo 11/17/19 ([facility]): SUMMARY  The left ventricular size is normal.  There is normal left ventricular wall thickness.  The left ventricular wall motion is normal.  Left ventricular systolic function is normal.  LV ejection fraction = 60-65%.  Left ventricular filling pattern is [minimally] prolonged relaxation.  LAP is normal.  The right ventricle is normal in size and function.  The atrial sizes are normal.  There was insufficient TR detected to calculate RV systolic pressure.  There is no comparison study available.    Past Medical History:  Diagnosis Date   ADHD    Anxiety 2012   Chronic neck pain    Depression    Fibromyalgia    Hepatitis 2008   Hyperlipemia    Hypertension 2022   Hypothyroidism 1989   Neuropathy    chronic demyelinating polyradiculoneuropathy, s/p IVIG   Pneumonia 2012   Raynaud's disease    Thyroid disease     Past Surgical History:  Procedure Laterality Date   arm surgery Right    Fracture    CESAREAN SECTION     LEG SURGERY Right    Fracture   TUBAL LIGATION      MEDICATIONS:  amLODipine (NORVASC) 2.5 MG tablet   amphetamine-dextroamphetamine (ADDERALL) 20 MG tablet   atorvastatin (LIPITOR) 20 MG tablet   CALCIUM-MAGNESIUM-ZINC PO   DULoxetine (CYMBALTA) 60 MG capsule   estradiol (ESTRACE) 0.1 MG/GM vaginal cream   gabapentin (NEURONTIN) 300 MG capsule   levothyroxine (SYNTHROID) 112 MCG tablet   liothyronine (CYTOMEL) 5 MCG tablet   LORazepam (ATIVAN) 2 MG tablet   Polyethylene Glycol 400 (BLINK TEARS OP)   traZODone (DESYREL) 100 MG tablet   No current facility-administered medications for this encounter.    Shonna Chock, PA-C Surgical Short Stay/Anesthesiology [facility] Phone [phone] [facility] Phone [phone] 01/10/2021 5:18 PM

## 2021-01-10 NOTE — Anesthesia Preprocedure Evaluation (Addendum)
Anesthesia Evaluation  Patient identified by MRN, date of birth, ID band Patient awake    Reviewed: Allergy & Precautions, NPO status , Patient's Chart, lab work & pertinent test results  Airway Mallampati: II  TM Distance: >3 FB Neck ROM: Full    Dental no notable dental hx.    Pulmonary Current Smoker,    Pulmonary exam normal breath sounds clear to auscultation       Cardiovascular hypertension, Normal cardiovascular exam Rhythm:Regular Rate:Normal     Neuro/Psych negative neurological ROS  negative psych ROS   GI/Hepatic negative GI ROS, Neg liver ROS,   Endo/Other  Hypothyroidism   Renal/GU negative Renal ROS  negative genitourinary   Musculoskeletal  (+) Fibromyalgia -  Abdominal   Peds negative pediatric ROS (+)  Hematology negative hematology ROS (+)   Anesthesia Other Findings   Reproductive/Obstetrics negative OB ROS                            Anesthesia Physical Anesthesia Plan  ASA: 2  Anesthesia Plan: General   Post-op Pain Management:    Induction: Intravenous  PONV Risk Score and Plan: 2 and Ondansetron, Dexamethasone and Treatment may vary due to age or medical condition  Airway Management Planned: Oral ETT  Additional Equipment:   Intra-op Plan:   Post-operative Plan: Extubation in OR  Informed Consent: I have reviewed the patients History and Physical, chart, labs and discussed the procedure including the risks, benefits and alternatives for the proposed anesthesia with the patient or authorized representative who has indicated his/her understanding and acceptance.     Dental advisory given  Plan Discussed with: CRNA and Surgeon  Anesthesia Plan Comments: (PAT note written 01/10/2021 by Shonna Chock, PA-C. )       Anesthesia Quick Evaluation

## 2021-01-15 ENCOUNTER — Other Ambulatory Visit (HOSPITAL_COMMUNITY)
Admission: RE | Admit: 2021-01-15 | Discharge: 2021-01-15 | Disposition: A | Discharge status: Home / Self Care | Payer: Medicare HMO | Source: Ambulatory Visit | Attending: Neurosurgery | Admitting: Neurosurgery

## 2021-01-15 ENCOUNTER — Other Ambulatory Visit: Payer: Self-pay

## 2021-01-15 DIAGNOSIS — Z01812 Encounter for preprocedural laboratory examination: Secondary | ICD-10-CM | POA: Insufficient documentation

## 2021-01-15 DIAGNOSIS — Z20822 Contact with and (suspected) exposure to covid-19: Secondary | ICD-10-CM | POA: Insufficient documentation

## 2021-01-15 LAB — SARS CORONAVIRUS 2 (TAT 6-24 HRS): SARS Coronavirus 2: NEGATIVE

## 2021-01-17 ENCOUNTER — Inpatient Hospital Stay (HOSPITAL_COMMUNITY): Payer: Medicare HMO | Admitting: Emergency Medicine

## 2021-01-17 ENCOUNTER — Encounter (HOSPITAL_COMMUNITY)
Admission: RE | Disposition: A | Discharge status: Home / Self Care | Payer: Self-pay | Source: Home / Self Care | Attending: Neurosurgery

## 2021-01-17 ENCOUNTER — Inpatient Hospital Stay (HOSPITAL_COMMUNITY)
Admission: RE | Admit: 2021-01-17 | Discharge: 2021-01-18 | DRG: 455 | Disposition: A | Discharge status: Home / Self Care | Payer: Medicare HMO | Attending: Neurosurgery | Admitting: Neurosurgery

## 2021-01-17 ENCOUNTER — Inpatient Hospital Stay (HOSPITAL_COMMUNITY): Payer: Medicare HMO

## 2021-01-17 ENCOUNTER — Inpatient Hospital Stay (HOSPITAL_COMMUNITY): Payer: Medicare HMO | Admitting: Anesthesiology

## 2021-01-17 DIAGNOSIS — F17210 Nicotine dependence, cigarettes, uncomplicated: Secondary | ICD-10-CM | POA: Diagnosis not present

## 2021-01-17 DIAGNOSIS — Z8249 Family history of ischemic heart disease and other diseases of the circulatory system: Secondary | ICD-10-CM | POA: Diagnosis not present

## 2021-01-17 DIAGNOSIS — M5416 Radiculopathy, lumbar region: Secondary | ICD-10-CM | POA: Diagnosis present

## 2021-01-17 DIAGNOSIS — E039 Hypothyroidism, unspecified: Secondary | ICD-10-CM | POA: Diagnosis not present

## 2021-01-17 DIAGNOSIS — Z20822 Contact with and (suspected) exposure to covid-19: Secondary | ICD-10-CM | POA: Diagnosis not present

## 2021-01-17 DIAGNOSIS — M48061 Spinal stenosis, lumbar region without neurogenic claudication: Secondary | ICD-10-CM | POA: Diagnosis present

## 2021-01-17 DIAGNOSIS — G6181 Chronic inflammatory demyelinating polyneuritis: Secondary | ICD-10-CM | POA: Diagnosis not present

## 2021-01-17 DIAGNOSIS — M797 Fibromyalgia: Secondary | ICD-10-CM | POA: Diagnosis not present

## 2021-01-17 DIAGNOSIS — Z8701 Personal history of pneumonia (recurrent): Secondary | ICD-10-CM | POA: Diagnosis not present

## 2021-01-17 DIAGNOSIS — I10 Essential (primary) hypertension: Secondary | ICD-10-CM | POA: Diagnosis not present

## 2021-01-17 DIAGNOSIS — E785 Hyperlipidemia, unspecified: Secondary | ICD-10-CM | POA: Diagnosis not present

## 2021-01-17 DIAGNOSIS — I7300 Raynaud's syndrome without gangrene: Secondary | ICD-10-CM | POA: Diagnosis present

## 2021-01-17 DIAGNOSIS — M4316 Spondylolisthesis, lumbar region: Secondary | ICD-10-CM | POA: Diagnosis not present

## 2021-01-17 DIAGNOSIS — Z8349 Family history of other endocrine, nutritional and metabolic diseases: Secondary | ICD-10-CM | POA: Diagnosis not present

## 2021-01-17 DIAGNOSIS — Z419 Encounter for procedure for purposes other than remedying health state, unspecified: Secondary | ICD-10-CM

## 2021-01-17 DIAGNOSIS — M549 Dorsalgia, unspecified: Secondary | ICD-10-CM | POA: Diagnosis present

## 2021-01-17 HISTORY — PX: TRANSFORAMINAL LUMBAR INTERBODY FUSION (TLIF) WITH PEDICLE SCREW FIXATION 1 LEVEL: SHX6141

## 2021-01-17 LAB — ABO/RH: ABO/RH(D): O POS

## 2021-01-17 SURGERY — TRANSFORAMINAL LUMBAR INTERBODY FUSION (TLIF) WITH PEDICLE SCREW FIXATION 1 LEVEL
Anesthesia: General

## 2021-01-17 MED ORDER — DEXMEDETOMIDINE (PRECEDEX) IN NS 20 MCG/5ML (4 MCG/ML) IV SYRINGE
PREFILLED_SYRINGE | INTRAVENOUS | Status: DC | PRN
  Administered 2021-01-17: 8 ug via INTRAVENOUS

## 2021-01-17 MED ORDER — BUPIVACAINE LIPOSOME 1.3 % IJ SUSP
INTRAMUSCULAR | Status: AC
  Filled 2021-01-17: qty 20

## 2021-01-17 MED ORDER — ACETAMINOPHEN 325 MG PO TABS
650.0000 mg | ORAL_TABLET | ORAL | Status: DC | PRN
  Administered 2021-01-18: 650 mg via ORAL
  Filled 2021-01-17: qty 2

## 2021-01-17 MED ORDER — AMPHETAMINE-DEXTROAMPHETAMINE 10 MG PO TABS: 10.0000 mg | ORAL_TABLET | Freq: Every day | ORAL | Status: DC

## 2021-01-17 MED ORDER — CHLORHEXIDINE GLUCONATE CLOTH 2 % EX PADS: 6.0000 | MEDICATED_PAD | Freq: Once | CUTANEOUS | Status: DC

## 2021-01-17 MED ORDER — LIDOCAINE 2% (20 MG/ML) 5 ML SYRINGE
INTRAMUSCULAR | Status: DC | PRN
  Administered 2021-01-17: 100 mg via INTRAVENOUS

## 2021-01-17 MED ORDER — MIDAZOLAM HCL 2 MG/2ML IJ SOLN
INTRAMUSCULAR | Status: AC
  Filled 2021-01-17: qty 2

## 2021-01-17 MED ORDER — ONDANSETRON HCL 4 MG/2ML IJ SOLN: 4.0000 mg | Freq: Once | INTRAMUSCULAR | Status: DC | PRN

## 2021-01-17 MED ORDER — CHLORHEXIDINE GLUCONATE 0.12 % MT SOLN
OROMUCOSAL | Status: AC
  Administered 2021-01-17: 15 mL via OROMUCOSAL
  Filled 2021-01-17: qty 15

## 2021-01-17 MED ORDER — ONDANSETRON HCL 4 MG/2ML IJ SOLN
INTRAMUSCULAR | Status: DC | PRN
  Administered 2021-01-17: 4 mg via INTRAVENOUS

## 2021-01-17 MED ORDER — ESTRADIOL 0.1 MG/GM VA CREA: 1.0000 | TOPICAL_CREAM | VAGINAL | Status: DC

## 2021-01-17 MED ORDER — THROMBIN 5000 UNITS EX SOLR
CUTANEOUS | Status: AC
  Filled 2021-01-17: qty 5000

## 2021-01-17 MED ORDER — SODIUM CHLORIDE 0.9 % IV SOLN: 250.0000 mL | INTRAVENOUS | Status: DC

## 2021-01-17 MED ORDER — OXYCODONE HCL 5 MG PO TABS
ORAL_TABLET | ORAL | Status: AC
  Filled 2021-01-17: qty 1

## 2021-01-17 MED ORDER — OXYCODONE HCL 5 MG PO TABS
5.0000 mg | ORAL_TABLET | Freq: Once | ORAL | Status: AC
  Administered 2021-01-17: 5 mg via ORAL

## 2021-01-17 MED ORDER — CALCIUM-MAGNESIUM-ZINC 333-133-5 MG PO TABS: 1.0000 | ORAL_TABLET | Freq: Every day | ORAL | Status: DC

## 2021-01-17 MED ORDER — ACETAMINOPHEN 10 MG/ML IV SOLN
INTRAVENOUS | Status: AC
  Filled 2021-01-17: qty 100

## 2021-01-17 MED ORDER — SODIUM CHLORIDE (PF) 0.9 % IJ SOLN
INTRAMUSCULAR | Status: DC | PRN
  Administered 2021-01-17: 10 mL

## 2021-01-17 MED ORDER — HYDROMORPHONE HCL 1 MG/ML IJ SOLN
INTRAMUSCULAR | Status: AC
  Filled 2021-01-17: qty 1

## 2021-01-17 MED ORDER — OXYCODONE HCL 5 MG PO TABS
5.0000 mg | ORAL_TABLET | ORAL | Status: DC | PRN
  Administered 2021-01-17 – 2021-01-18 (×4): 10 mg via ORAL
  Filled 2021-01-17 (×4): qty 2

## 2021-01-17 MED ORDER — LIDOCAINE-EPINEPHRINE 1 %-1:100000 IJ SOLN
INTRAMUSCULAR | Status: DC | PRN
  Administered 2021-01-17: 10 mL

## 2021-01-17 MED ORDER — LIOTHYRONINE SODIUM 5 MCG PO TABS
7.0000 ug | ORAL_TABLET | Freq: Every day | ORAL | Status: DC
  Administered 2021-01-17 – 2021-01-18 (×2): 7.5 ug via ORAL
  Filled 2021-01-17 (×2): qty 2

## 2021-01-17 MED ORDER — HYDROMORPHONE HCL 1 MG/ML IJ SOLN
INTRAMUSCULAR | Status: AC
  Filled 2021-01-17: qty 0.5

## 2021-01-17 MED ORDER — ATORVASTATIN CALCIUM 10 MG PO TABS
20.0000 mg | ORAL_TABLET | Freq: Every day | ORAL | Status: DC
  Administered 2021-01-18: 20 mg via ORAL
  Filled 2021-01-17: qty 2

## 2021-01-17 MED ORDER — CEFAZOLIN SODIUM-DEXTROSE 2-4 GM/100ML-% IV SOLN: 2.0000 g | INTRAVENOUS | Status: DC

## 2021-01-17 MED ORDER — MIDAZOLAM HCL 5 MG/5ML IJ SOLN
INTRAMUSCULAR | Status: DC | PRN
  Administered 2021-01-17 (×2): 2 mg via INTRAVENOUS

## 2021-01-17 MED ORDER — PROPOFOL 10 MG/ML IV BOLUS
INTRAVENOUS | Status: AC
  Filled 2021-01-17: qty 20

## 2021-01-17 MED ORDER — KETAMINE HCL 50 MG/5ML IJ SOSY
PREFILLED_SYRINGE | INTRAMUSCULAR | Status: AC
  Filled 2021-01-17: qty 5

## 2021-01-17 MED ORDER — HYDROMORPHONE HCL 1 MG/ML IJ SOLN
0.5000 mg | INTRAMUSCULAR | Status: DC | PRN
Start: 2021-01-17 — End: 2021-01-18
  Administered 2021-01-17: 0.5 mg via INTRAVENOUS
  Filled 2021-01-17: qty 0.5

## 2021-01-17 MED ORDER — PROPOFOL 10 MG/ML IV BOLUS
INTRAVENOUS | Status: DC | PRN
  Administered 2021-01-17: 110 mg via INTRAVENOUS
  Administered 2021-01-17: 40 mg via INTRAVENOUS

## 2021-01-17 MED ORDER — OXYCODONE HCL 5 MG PO TABS: 5.0000 mg | ORAL_TABLET | ORAL | Status: DC | PRN

## 2021-01-17 MED ORDER — KETOROLAC TROMETHAMINE 30 MG/ML IJ SOLN
30.0000 mg | Freq: Four times a day (QID) | INTRAMUSCULAR | Status: DC
  Administered 2021-01-17 – 2021-01-18 (×4): 30 mg via INTRAVENOUS
  Filled 2021-01-17 (×4): qty 1

## 2021-01-17 MED ORDER — LIDOCAINE-EPINEPHRINE 1 %-1:100000 IJ SOLN
INTRAMUSCULAR | Status: AC
  Filled 2021-01-17: qty 1

## 2021-01-17 MED ORDER — VANCOMYCIN HCL IN DEXTROSE 1-5 GM/200ML-% IV SOLN
INTRAVENOUS | Status: AC
  Filled 2021-01-17: qty 200

## 2021-01-17 MED ORDER — FLEET ENEMA 7-19 GM/118ML RE ENEM: 1.0000 | ENEMA | Freq: Once | RECTAL | Status: DC | PRN

## 2021-01-17 MED ORDER — LEVOTHYROXINE SODIUM 112 MCG PO TABS
112.0000 ug | ORAL_TABLET | Freq: Every day | ORAL | Status: DC
  Administered 2021-01-18: 112 ug via ORAL
  Filled 2021-01-17: qty 1

## 2021-01-17 MED ORDER — SUGAMMADEX SODIUM 200 MG/2ML IV SOLN
INTRAVENOUS | Status: DC | PRN
  Administered 2021-01-17: 200 mg via INTRAVENOUS

## 2021-01-17 MED ORDER — POLYVINYL ALCOHOL 1.4 % OP SOLN
1.0000 [drp] | OPHTHALMIC | Status: DC | PRN
  Filled 2021-01-17: qty 15

## 2021-01-17 MED ORDER — BUPIVACAINE HCL (PF) 0.5 % IJ SOLN
INTRAMUSCULAR | Status: AC
  Filled 2021-01-17: qty 30

## 2021-01-17 MED ORDER — DOCUSATE SODIUM 100 MG PO CAPS
100.0000 mg | ORAL_CAPSULE | Freq: Two times a day (BID) | ORAL | Status: DC
  Administered 2021-01-17 – 2021-01-18 (×2): 100 mg via ORAL
  Filled 2021-01-17 (×2): qty 1

## 2021-01-17 MED ORDER — DEXAMETHASONE SODIUM PHOSPHATE 10 MG/ML IJ SOLN
INTRAMUSCULAR | Status: DC | PRN
Start: 2021-01-17 — End: 2021-01-17
  Administered 2021-01-17: 10 mg via INTRAVENOUS

## 2021-01-17 MED ORDER — BUPIVACAINE HCL (PF) 0.5 % IJ SOLN
INTRAMUSCULAR | Status: DC | PRN
  Administered 2021-01-17: 30 mL

## 2021-01-17 MED ORDER — LACTATED RINGERS IV SOLN: INTRAVENOUS | Status: DC

## 2021-01-17 MED ORDER — ONDANSETRON HCL 4 MG PO TABS: 4.0000 mg | ORAL_TABLET | Freq: Four times a day (QID) | ORAL | Status: DC | PRN

## 2021-01-17 MED ORDER — ACETAMINOPHEN 10 MG/ML IV SOLN
INTRAVENOUS | Status: DC | PRN
  Administered 2021-01-17: 1000 mg via INTRAVENOUS

## 2021-01-17 MED ORDER — ACETAMINOPHEN 650 MG RE SUPP: 650.0000 mg | RECTAL | Status: DC | PRN

## 2021-01-17 MED ORDER — VANCOMYCIN HCL IN DEXTROSE 1-5 GM/200ML-% IV SOLN
1000.0000 mg | Freq: Once | INTRAVENOUS | Status: AC
  Administered 2021-01-17: 1000 mg via INTRAVENOUS
  Filled 2021-01-17: qty 200

## 2021-01-17 MED ORDER — GABAPENTIN 300 MG PO CAPS
600.0000 mg | ORAL_CAPSULE | Freq: Every day | ORAL | Status: DC
  Administered 2021-01-17: 600 mg via ORAL
  Filled 2021-01-17: qty 2

## 2021-01-17 MED ORDER — POLYETHYLENE GLYCOL 400 0.25 % OP SOLN: 1.0000 [drp] | Freq: Every day | OPHTHALMIC | Status: DC | PRN

## 2021-01-17 MED ORDER — FENTANYL CITRATE (PF) 100 MCG/2ML IJ SOLN
INTRAMUSCULAR | Status: DC | PRN
  Administered 2021-01-17 (×3): 50 ug via INTRAVENOUS

## 2021-01-17 MED ORDER — KETAMINE HCL 10 MG/ML IJ SOLN
INTRAMUSCULAR | Status: DC | PRN
  Administered 2021-01-17: 5 mg via INTRAVENOUS
  Administered 2021-01-17: 10 mg via INTRAVENOUS
  Administered 2021-01-17: 25 mg via INTRAVENOUS
  Administered 2021-01-17 (×2): 5 mg via INTRAVENOUS

## 2021-01-17 MED ORDER — POLYETHYLENE GLYCOL 3350 17 G PO PACK: 17.0000 g | PACK | Freq: Every day | ORAL | Status: DC | PRN

## 2021-01-17 MED ORDER — OXYCODONE HCL ER 10 MG PO T12A
10.0000 mg | EXTENDED_RELEASE_TABLET | Freq: Two times a day (BID) | ORAL | Status: DC
  Administered 2021-01-17 – 2021-01-18 (×2): 10 mg via ORAL
  Filled 2021-01-17 (×2): qty 1

## 2021-01-17 MED ORDER — POTASSIUM CHLORIDE IN NACL 20-0.9 MEQ/L-% IV SOLN: INTRAVENOUS | Status: DC

## 2021-01-17 MED ORDER — CHLORHEXIDINE GLUCONATE 0.12 % MT SOLN: 15.0000 mL | Freq: Once | OROMUCOSAL | Status: AC

## 2021-01-17 MED ORDER — ROCURONIUM BROMIDE 10 MG/ML (PF) SYRINGE
PREFILLED_SYRINGE | INTRAVENOUS | Status: DC | PRN
  Administered 2021-01-17: 20 mg via INTRAVENOUS
  Administered 2021-01-17: 30 mg via INTRAVENOUS
  Administered 2021-01-17: 70 mg via INTRAVENOUS
  Administered 2021-01-17: 20 mg via INTRAVENOUS

## 2021-01-17 MED ORDER — THROMBIN 5000 UNITS EX SOLR
OROMUCOSAL | Status: DC | PRN
  Administered 2021-01-17: 5 mL via TOPICAL

## 2021-01-17 MED ORDER — TRAZODONE HCL 50 MG PO TABS
100.0000 mg | ORAL_TABLET | Freq: Every day | ORAL | Status: DC
  Administered 2021-01-17: 100 mg via ORAL
  Filled 2021-01-17: qty 2

## 2021-01-17 MED ORDER — SODIUM CHLORIDE 0.9% FLUSH: 3.0000 mL | INTRAVENOUS | Status: DC | PRN

## 2021-01-17 MED ORDER — MENTHOL 3 MG MT LOZG: 1.0000 | LOZENGE | OROMUCOSAL | Status: DC | PRN

## 2021-01-17 MED ORDER — LORAZEPAM 0.5 MG PO TABS: 1.0000 mg | ORAL_TABLET | Freq: Three times a day (TID) | ORAL | Status: DC | PRN

## 2021-01-17 MED ORDER — AMLODIPINE BESYLATE 5 MG PO TABS
2.5000 mg | ORAL_TABLET | Freq: Every day | ORAL | Status: DC
  Administered 2021-01-18: 2.5 mg via ORAL
  Filled 2021-01-17: qty 1

## 2021-01-17 MED ORDER — ACETAMINOPHEN 10 MG/ML IV SOLN
1000.0000 mg | Freq: Once | INTRAVENOUS | Status: DC | PRN
  Administered 2021-01-17: 1000 mg via INTRAVENOUS

## 2021-01-17 MED ORDER — VANCOMYCIN HCL IN DEXTROSE 1-5 GM/200ML-% IV SOLN
1000.0000 mg | INTRAVENOUS | Status: AC
  Administered 2021-01-17: 1000 mg via INTRAVENOUS

## 2021-01-17 MED ORDER — HYDROMORPHONE HCL 1 MG/ML IJ SOLN
0.2500 mg | INTRAMUSCULAR | Status: DC | PRN
  Administered 2021-01-17 (×2): 0.5 mg via INTRAVENOUS

## 2021-01-17 MED ORDER — 0.9 % SODIUM CHLORIDE (POUR BTL) OPTIME
TOPICAL | Status: DC | PRN
  Administered 2021-01-17: 1000 mL

## 2021-01-17 MED ORDER — ONDANSETRON HCL 4 MG/2ML IJ SOLN: 4.0000 mg | Freq: Four times a day (QID) | INTRAMUSCULAR | Status: DC | PRN

## 2021-01-17 MED ORDER — SODIUM CHLORIDE 0.9% FLUSH
3.0000 mL | Freq: Two times a day (BID) | INTRAVENOUS | Status: DC
  Administered 2021-01-17 – 2021-01-18 (×3): 3 mL via INTRAVENOUS

## 2021-01-17 MED ORDER — PHENOL 1.4 % MT LIQD: 1.0000 | OROMUCOSAL | Status: DC | PRN

## 2021-01-17 MED ORDER — ORAL CARE MOUTH RINSE: 15.0000 mL | Freq: Once | OROMUCOSAL | Status: AC

## 2021-01-17 MED ORDER — BUPIVACAINE LIPOSOME 1.3 % IJ SUSP
INTRAMUSCULAR | Status: DC | PRN
  Administered 2021-01-17: 20 mL

## 2021-01-17 MED ORDER — HYDROMORPHONE HCL 1 MG/ML IJ SOLN
INTRAMUSCULAR | Status: DC | PRN
  Administered 2021-01-17 (×2): .5 mg via INTRAVENOUS

## 2021-01-17 MED ORDER — DULOXETINE HCL 30 MG PO CPEP
120.0000 mg | ORAL_CAPSULE | Freq: Every day | ORAL | Status: DC
  Administered 2021-01-18: 120 mg via ORAL
  Filled 2021-01-17: qty 4

## 2021-01-17 MED ORDER — FENTANYL CITRATE (PF) 250 MCG/5ML IJ SOLN
INTRAMUSCULAR | Status: AC
  Filled 2021-01-17: qty 5

## 2021-01-17 SURGICAL SUPPLY — 73 items
ADH SKN CLS APL DERMABOND .7 (GAUZE/BANDAGES/DRESSINGS) ×1
BAG COUNTER SPONGE SURGICOUNT (BAG) ×3 IMPLANT
BAG SPNG CNTER NS LX DISP (BAG) ×2
BAND INSRT 18 STRL LF DISP RB (MISCELLANEOUS)
BAND RUBBER #18 3X1/16 STRL (MISCELLANEOUS) IMPLANT
BASKET BONE COLLECTION (BASKET) ×1 IMPLANT
BLADE CLIPPER SURG (BLADE) IMPLANT
BUR CARBIDE MATCH 3.0 (BURR) IMPLANT
BUR MATCHSTICK NEURO 3.0 LAGG (BURR) ×2 IMPLANT
BUR PRECISION FLUTE 5.0 (BURR) IMPLANT
CAGE SABLE 10X26 8D (Cage) ×1 IMPLANT
CANISTER SUCT 3000ML PPV (MISCELLANEOUS) ×2 IMPLANT
CAP LOCKING (Cap) ×8 IMPLANT
CAP LOCKING 5.5 CREO (Cap) IMPLANT
CNTNR URN SCR LID CUP LEK RST (MISCELLANEOUS) ×1 IMPLANT
CONT SPEC 4OZ STRL OR WHT (MISCELLANEOUS) ×2
COVER BACK TABLE 60X90IN (DRAPES) ×2 IMPLANT
DECANTER SPIKE VIAL GLASS SM (MISCELLANEOUS) ×1 IMPLANT
DERMABOND ADVANCED (GAUZE/BANDAGES/DRESSINGS) ×1
DERMABOND ADVANCED .7 DNX12 (GAUZE/BANDAGES/DRESSINGS) ×1 IMPLANT
DRAIN JACKSON PRATT 10MM FLAT (MISCELLANEOUS) ×1 IMPLANT
DRAPE 3/4 80X56 (DRAPES) ×2 IMPLANT
DRAPE C-ARM 42X72 X-RAY (DRAPES) ×2 IMPLANT
DRAPE C-ARMOR (DRAPES) ×2 IMPLANT
DRAPE LAPAROTOMY 100X72X124 (DRAPES) ×2 IMPLANT
DRAPE MICROSCOPE LEICA (MISCELLANEOUS) IMPLANT
DRSG OPSITE POSTOP 4X6 (GAUZE/BANDAGES/DRESSINGS) ×1 IMPLANT
DURAPREP 26ML APPLICATOR (WOUND CARE) ×2 IMPLANT
ELECT BLADE INSULATED 6.5IN (ELECTROSURGICAL) ×2
ELECT REM PT RETURN 9FT ADLT (ELECTROSURGICAL) ×2
ELECTRODE BLDE INSULATED 6.5IN (ELECTROSURGICAL) ×1 IMPLANT
ELECTRODE REM PT RTRN 9FT ADLT (ELECTROSURGICAL) ×1 IMPLANT
EVACUATOR SILICONE 100CC (DRAIN) ×1 IMPLANT
GAUZE 4X4 16PLY ~~LOC~~+RFID DBL (SPONGE) ×1 IMPLANT
GAUZE SPONGE 4X4 12PLY STRL (GAUZE/BANDAGES/DRESSINGS) IMPLANT
GLOVE EXAM NITRILE XL STR (GLOVE) IMPLANT
GLOVE SURG LTX SZ7.5 (GLOVE) ×2 IMPLANT
GLOVE SURG UNDER POLY LF SZ7.5 (GLOVE) ×2 IMPLANT
GOWN STRL REUS W/ TWL LRG LVL3 (GOWN DISPOSABLE) ×1 IMPLANT
GOWN STRL REUS W/ TWL XL LVL3 (GOWN DISPOSABLE) ×1 IMPLANT
GOWN STRL REUS W/TWL 2XL LVL3 (GOWN DISPOSABLE) IMPLANT
GOWN STRL REUS W/TWL LRG LVL3 (GOWN DISPOSABLE) ×2
GOWN STRL REUS W/TWL XL LVL3 (GOWN DISPOSABLE) ×2
HEMOSTAT POWDER KIT SURGIFOAM (HEMOSTASIS) ×2 IMPLANT
KIT BASIN OR (CUSTOM PROCEDURE TRAY) ×2 IMPLANT
KIT INFUSE XX SMALL 0.7CC (Orthopedic Implant) ×1 IMPLANT
KIT TURNOVER KIT B (KITS) ×2 IMPLANT
MARKER SKIN DUAL TIP RULER LAB (MISCELLANEOUS) ×2 IMPLANT
MILL MEDIUM DISP (BLADE) ×2 IMPLANT
NDL HYPO 18GX1.5 BLUNT FILL (NEEDLE) IMPLANT
NDL HYPO 21X1.5 SAFETY (NEEDLE) IMPLANT
NDL SPNL 18GX3.5 QUINCKE PK (NEEDLE) IMPLANT
NEEDLE HYPO 18GX1.5 BLUNT FILL (NEEDLE) IMPLANT
NEEDLE HYPO 21X1.5 SAFETY (NEEDLE) ×2 IMPLANT
NEEDLE HYPO 22GX1.5 SAFETY (NEEDLE) ×2 IMPLANT
NEEDLE SPNL 18GX3.5 QUINCKE PK (NEEDLE) IMPLANT
NS IRRIG 1000ML POUR BTL (IV SOLUTION) ×2 IMPLANT
PACK LAMINECTOMY NEURO (CUSTOM PROCEDURE TRAY) ×2 IMPLANT
PAD ARMBOARD 7.5X6 YLW CONV (MISCELLANEOUS) ×6 IMPLANT
PUTTY DBF 6CC CORTICAL FIBERS (Putty) ×1 IMPLANT
ROD CREO 45MM SPINAL (Rod) ×2 IMPLANT
SCREW 6.5X45 (Screw) ×4 IMPLANT
SPONGE SURGIFOAM ABS GEL 100 (HEMOSTASIS) IMPLANT
SPONGE T-LAP 4X18 ~~LOC~~+RFID (SPONGE) ×1 IMPLANT
SUT MNCRL AB 4-0 PS2 18 (SUTURE) ×2 IMPLANT
SUT VIC AB 0 CT1 18XCR BRD8 (SUTURE) ×1 IMPLANT
SUT VIC AB 0 CT1 8-18 (SUTURE) ×4
SUT VIC AB 2-0 CP2 18 (SUTURE) ×3 IMPLANT
SYR 30ML LL (SYRINGE) ×2 IMPLANT
TOWEL GREEN STERILE (TOWEL DISPOSABLE) ×2 IMPLANT
TOWEL GREEN STERILE FF (TOWEL DISPOSABLE) ×2 IMPLANT
TRAY FOLEY MTR SLVR 16FR STAT (SET/KITS/TRAYS/PACK) ×2 IMPLANT
WATER STERILE IRR 1000ML POUR (IV SOLUTION) ×2 IMPLANT

## 2021-01-17 NOTE — Transfer of Care (Signed)
Immediate Anesthesia Transfer of Care Note  Patient: Haley Valdez  Procedure(s) Performed: Transforaminal Lumbar Interbody Fusion Lumbar Four- Five  Patient Location: PACU  Anesthesia Type:General  Level of Consciousness: awake, alert , patient cooperative and responds to stimulation  Airway & Oxygen Therapy: Patient Spontanous Breathing  Post-op Assessment: Report given to RN and Post -op Vital signs reviewed and stable  Post vital signs: Reviewed and stable  Last Vitals:  Vitals Value Taken Time  BP 122/81 01/17/21 1222  Temp    Pulse 69 01/17/21 1224  Resp 15 01/17/21 1224  SpO2 91 % 01/17/21 1224  Vitals shown include unvalidated device data.  Last Pain:  Vitals:   01/17/21 0656  TempSrc:   PainSc: 8       Patients Stated Pain Goal: 3 (01/17/21 0656)  Complications: No notable events documented.

## 2021-01-17 NOTE — Anesthesia Procedure Notes (Signed)
Procedure Name: Intubation Date/Time: 01/17/2021 7:45 AM Performed by: Lynnell Chad, CRNA Pre-anesthesia Checklist: Patient identified, Emergency Drugs available, Suction available and Patient being monitored Patient Re-evaluated:Patient Re-evaluated prior to induction Oxygen Delivery Method: Circle System Utilized Preoxygenation: Pre-oxygenation with 100% oxygen Induction Type: IV induction Ventilation: Mask ventilation without difficulty Laryngoscope Size: Miller and 2 Grade View: Grade II Tube type: Oral Tube size: 7.0 mm Number of attempts: 1 Airway Equipment and Method: Stylet and Oral airway Placement Confirmation: ETT inserted through vocal cords under direct vision, positive ETCO2 and breath sounds checked- equal and bilateral Secured at: 21 cm Tube secured with: Tape Dental Injury: Teeth and Oropharynx as per pre-operative assessment

## 2021-01-17 NOTE — Progress Notes (Signed)
Orthopedic Tech Progress Note Patient Details:  Haley Valdez [birth year] [id number] Left back brace with RN Ortho Devices Type of Ortho Device: Lumbar corsett Ortho Device/Splint Interventions: Ordered      Haley Valdez Haley Valdez 01/17/2021, 2:18 PM

## 2021-01-17 NOTE — Op Note (Signed)
PREOP DIAGNOSIS: Lumbar radiculopathy with spondylolisthesis, L4-5  POSTOP DIAGNOSIS: Lumbar radiculopathy with spondylolisthesis, L4-5  PROCEDURE: 1. L4-5  lumbar interbody fusion, including laminectomy, bilateral facetectomies and foraminotomies 2. L4-5 posterolateral arhtrodesis 2. Placement of interbody cage L4-5 3. Nonsegmental instrumentation with percutaneously placed pedicle screw and rod construct at L4-5 4. Harvest of local autograft 5. Use of morselized allograft 6. Use of microscope for microdissection  SURGEON: Dr. Hoyt Koch, MD  ASSISTANT: none   ANESTHESIA: General Endotracheal  EBL: 250 ml  IMPLANTS:  Globus 6.5 x 45 mm pedicle screws x 4, rod x 2, caps x 4 Expandable cage ~ 11 mm x 26 mm   SPECIMENS: None  DRAINS: None  COMPLICATIONS: none  CONDITION: Stable to PACU  HISTORY: Haley Valdez is a 60 y.o. female who initially presented to the outpatient clinic with bilateral lumbar radiculopathy that has been progressive despite nonsurgical treatments.  MRI showed L4-5 spondylolisthesis with associated severe stenosis.  Treatment options were discussed and the patient elected to proceed with TLIF at L4-5. risks, benefits, alternatives, and expected convalescence were discussed with the patient.  Risks discussed included but were not limited to bleeding, pain, infection, scar, pseudoarthrosis, CSF leak, neurologic deficit, paralysis, and death.  The patient wished to proceed with surgery and informed consent was obtained.  PROCEDURE IN DETAIL: After informed consent was obtained and witnessed, the patient was brought to the operating room. After induction of general anesthesia, the patient was positioned on the operative table in the prone position on a Wilson frame, kept in neutral position,  with all pressure points meticulously padded. The skin of the low back was then prepped and draped in the usual sterile fashion.  With the aid of C-arm x-ray, a linear  incision was planned over the midline spanning L4 and L5.  Incision was made with a 10 blade and monopolar electrocautery was used to incise the fascia and dissected the paraspinous muscles off of the L4 and L5 lamina in subperiosteal fashion.  Dissection continued out laterally, exposing the transverse processes of L4 and L5.  Laminectomy of L4 and the superior aspect of L5 was performed with Leksell rongeurs and high-speed drill.  Severe stenosis was noted.  Radical facetectomy was performed on the left side fully opening the foramen.  A partial facetectomy was performed on the right side with good exposure of the foramen.  The traversing and exiting nerve roots were well decompressed with removal of hypertrophied facets and underlying degenerative material.  Good decompression was confirmed with easy passage of nerve hook along with nerves.  The epidural veins in the foramen over the L4-5 disc on the left side was then coagulated and the disc space was exposed.  The disc was incised with a 15 blade and discectomy was performed with rongeurs, curettes, and disc shavers.  Trial disc interbody trial was used to select appropriately sized interbody.  The interbody space was then packed with extra extra small BMP as well as morselized allograft and morselized autograft.  An expandable 9 mm x 26 mm interbody was then placed in the interbody space under C-arm guidance and expanded until snug at about 12 mm.  Good reduction in spondylolisthesis was noted.  Using anatomic landmarks, pilot holes were selected for pedicle screw placement.  Under C-arm guidance, the pedicles were cannulated with a lanky probe, with ball ended feeler confirming good cannulation.  The screw holes were then tapped and palpated with ball ended probe which confirmed good bony channels.  6.5  x 45 mm screws were then placed bilaterally at L4 and L5.  Good purchase was noted.  The wound was then irrigated thoroughly.  Rods were placed in the tulip  heads and screw caps were placed and final tightened.  Morselized allograft and autograft was then placed in the lateral gutters bilaterally.  A medium Hemovac drain was placed in the subfascial space and tunneled out the skin and secured with stitch.  Meticulous hemostasis was obtained.  The muscle layer was closed with 0 Vicryl stitches.  The fascia layer was closed with 0 Vicryl stitches.  The dermal layer was closed with 2-0 Vicryl stitches in buried interrupted fashion.  Skin was closed with 4 Monocryl in subcuticular fashion followed by Dermabond and a sterile dressing.  Patient was then flipped supine and extubated by the anesthesia service following commands and all 4 extremities.  All counts were correct at the end of surgery.  No complications were noted.

## 2021-01-17 NOTE — H&P (Signed)
CC: back and leg pain  HPI:     Patient is a 60 y.o. female c/o back pain and L>R leg. She failed nonsurgical therapies, and her pain worsened.. She is here for elective TLIF.    Patient Active Problem List   Diagnosis Date Noted   Complaints of total body pain 10/14/2019   Spinal stenosis of lumbar region with neurogenic claudication 10/13/2019   Bilateral low back pain with sciatica 07/13/2019   Gait abnormality 06/01/2019   CIDP (chronic inflammatory demyelinating polyneuropathy) (HCC) 08/07/2017   Paresthesia 04/22/2017   Neck pain 04/22/2017   Past Medical History:  Diagnosis Date   ADHD    Anxiety 2012   Chronic neck pain    Depression    Fibromyalgia    Hepatitis 2008   Hyperlipemia    Hypertension 2022   Hypothyroidism 1989   Neuropathy    chronic demyelinating polyradiculoneuropathy, s/p IVIG   Pneumonia 2012   Raynaud's disease    Thyroid disease     Past Surgical History:  Procedure Laterality Date   arm surgery Right    Fracture   CESAREAN SECTION     LEG SURGERY Right    Fracture   TUBAL LIGATION      Medications Prior to Admission  Medication Sig Dispense Refill Last Dose   amLODipine (NORVASC) 2.5 MG tablet Take 2.5 mg by mouth daily.   01/17/2021 at 0430   amphetamine-dextroamphetamine (ADDERALL) 20 MG tablet Take 10-20 mg by mouth daily.   01/16/2021   atorvastatin (LIPITOR) 20 MG tablet Take 20 mg by mouth daily.   01/17/2021 at 0430   CALCIUM-MAGNESIUM-ZINC PO Take 1 tablet by mouth daily.   Past Week   DULoxetine (CYMBALTA) 60 MG capsule Take 1 capsule (60 mg total) by mouth daily. (Patient taking differently: Take 120 mg by mouth daily.) 90 capsule 4 01/17/2021 at 0430   estradiol (ESTRACE) 0.1 MG/GM vaginal cream Place 1 Applicatorful vaginally once a week.   Past Month   gabapentin (NEURONTIN) 300 MG capsule Take 1 capsule (300 mg total) by mouth 3 (three) times daily. (Patient taking differently: Take 600 mg by mouth at bedtime.) 270 capsule 3  01/16/2021   levothyroxine (SYNTHROID) 112 MCG tablet Take 112 mcg by mouth daily before breakfast.   01/17/2021 at 0430   liothyronine (CYTOMEL) 5 MCG tablet Take 2 tablets before breakfast and 1 tablet before dinner (Patient taking differently: Take 7 mcg by mouth daily.) 270 tablet 0 01/16/2021   LORazepam (ATIVAN) 2 MG tablet Take 1-2 mg by mouth 3 (three) times daily as needed for anxiety.   01/16/2021   Polyethylene Glycol 400 (BLINK TEARS OP) Place 1 drop into both eyes daily as needed (dry eyes).   01/16/2021   traZODone (DESYREL) 100 MG tablet Take 100 mg by mouth at bedtime.   01/16/2021   Allergies  Allergen Reactions   Nitrofurantoin Itching    Redness in feet    Wasp Venom Protein Other (See Comments)    cellutlitis    Social History   Tobacco Use   Smoking status: Every Day    Packs/day: 1.00    Types: Cigarettes   Smokeless tobacco: Never  Substance Use Topics   Alcohol use: Yes    Alcohol/week: 1.0 standard drink    Types: 1 Shots of liquor per week    Comment: 1 margarita a night    Family History  Problem Relation Age of Onset   Heart attack Mother    Thyroid  disease Mother    Stroke Father    Thyroid disease Sister      Review of Systems Pertinent items noted in HPI and remainder of comprehensive ROS otherwise negative.  Objective:   Patient Vitals for the past 8 hrs:  BP Temp Temp src Pulse Resp SpO2 Height Weight  01/17/21 0605 140/64 98.2 F (36.8 C) Oral 61 17 96 % 5\' 7"  (1.702 m) 69.4 kg   No intake/output data recorded. No intake/output data recorded.      General : Alert, cooperative, no distress, appears stated age   Head:  Normocephalic/atraumatic    Eyes: PERRL, conjunctiva/corneas clear, EOM's intact. Fundi could not be visualized Neck: Supple Chest:  Respirations unlabored Chest wall: no tenderness or deformity Heart: Regular rate and rhythm Abdomen: Soft, nontender and nondistended Extremities: warm and well-perfused Skin: normal  turgor, color and texture Neurologic:  Alert, oriented x 3.  Eyes open spontaneously. PERRL, EOMI, VFC, no facial droop. V1-3 intact.  No dysarthria, tongue protrusion symmetric.  CNII-XII intact. Normal strength, sensation and reflexes throughout.  No pronator drift, full strength in legs       Data ReviewCBC:  Lab Results  Component Value Date   WBC 6.9 01/09/2021   RBC 4.43 01/09/2021   BMP:  Lab Results  Component Value Date   GLUCOSE 95 01/09/2021   CO2 29 01/09/2021   BUN 10 01/09/2021   CREATININE 0.97 01/09/2021   CALCIUM 9.4 01/09/2021   Radiology review:    Assessment:   Active Problems:   * No active hospital problems. *  L4-5 spondylolisthesis with severe stenosis  Plan:   - L4-5 TLIF today

## 2021-01-18 ENCOUNTER — Encounter (HOSPITAL_COMMUNITY): Payer: Self-pay | Admitting: Neurosurgery

## 2021-01-18 MED ORDER — OXYCODONE HCL ER 10 MG PO T12A: 10.0000 mg | EXTENDED_RELEASE_TABLET | Freq: Two times a day (BID) | ORAL | 0 refills | Status: DC

## 2021-01-18 MED ORDER — OXYCODONE HCL 5 MG PO TABS: 5.0000 mg | ORAL_TABLET | ORAL | 0 refills | Status: DC | PRN

## 2021-01-18 NOTE — Plan of Care (Signed)
  Problem: Safety: Goal: Ability to remain free from injury will improve Outcome: Completed/Met   Problem: Education: Goal: Ability to verbalize activity precautions or restrictions will improve Outcome: Completed/Met Goal: Knowledge of the prescribed therapeutic regimen will improve Outcome: Completed/Met Goal: Understanding of discharge needs will improve Outcome: Completed/Met   Problem: Bowel/Gastric: Goal: Gastrointestinal status for postoperative course will improve Outcome: Completed/Met   Problem: Clinical Measurements: Goal: Ability to maintain clinical measurements within normal limits will improve Outcome: Completed/Met Goal: Postoperative complications will be avoided or minimized Outcome: Completed/Met Goal: Diagnostic test results will improve Outcome: Completed/Met   Problem: Skin Integrity: Goal: Will show signs of wound healing Outcome: Completed/Met   Problem: Health Behavior/Discharge Planning: Goal: Identification of resources available to assist in meeting health care needs will improve Outcome: Completed/Met   Problem: Bladder/Genitourinary: Goal: Urinary functional status for postoperative course will improve Outcome: Completed/Met   

## 2021-01-18 NOTE — Progress Notes (Signed)
  NEUROSURGERY PROGRESS NOTE   No issues overnight. Pain much better controlled this am. No leg pain. Ambulating well  EXAM:  BP 122/84 (BP Location: Right Arm)   Pulse (!) 57   Temp 98.3 F (36.8 C) (Oral)   Resp 18   Ht 5\' 7"  (1.702 m)   Wt 69.4 kg   SpO2 95%   BMI 23.96 kg/m   Awake, alert, oriented  Speech fluent, appropriate  CN grossly intact  5/5 BUE/BLE  JP in place, 300cc  IMPRESSION:  60 y.o. female POD#1 L4-5 TLIF, doing well  PLAN: - d/c home today   76, MD [facility]

## 2021-01-18 NOTE — Anesthesia Postprocedure Evaluation (Signed)
Anesthesia Post Note  Patient: Haley Valdez  Procedure(s) Performed: Transforaminal Lumbar Interbody Fusion Lumbar Four- Five     Patient location during evaluation: PACU Anesthesia Type: General Level of consciousness: awake and alert Pain management: pain level controlled Vital Signs Assessment: post-procedure vital signs reviewed and stable Respiratory status: spontaneous breathing, nonlabored ventilation, respiratory function stable and patient connected to nasal cannula oxygen Cardiovascular status: blood pressure returned to baseline and stable Postop Assessment: no apparent nausea or vomiting Anesthetic complications: no   No notable events documented.  Last Vitals:  Vitals:   01/18/21 0347 01/18/21 0724  BP: 116/70 122/84  Pulse: (!) 51 (!) 57  Resp: 18 18  Temp: 36.7 C 36.8 C  SpO2: 95% 95%    Last Pain:  Vitals:   01/18/21 0724  TempSrc: Oral  PainSc:                  Dietrich Samuelson S

## 2021-01-18 NOTE — Evaluation (Signed)
Physical Therapy Evaluation Patient Details Name: Haley Valdez MRN: [id number] DOB: 1960/07/17 Today's Date: 01/18/2021  History of Present Illness  60 year old female s/p L4-5 TLIF. PMH significant for smoking, HTN, fibromyalgia, ADHD, anxiety, hep c, hypothyroidism, neuropathy, R leg fx.  Clinical Impression  Pt presents to PT sp L4-5 TLIF. Pt is able to independently mobilize at this time and follows back precautions well. Pt dons brace independently at this time. Pt is encouraged to continue mobilizing frequently to aide in a complete return to baseline. PT recommends discharge home when medically ready. No further PT needs identified.       Recommendations for follow up therapy are one component of a multi-disciplinary discharge planning process, led by the attending physician.  Recommendations may be updated based on patient status, additional functional criteria and insurance authorization.  Follow Up Recommendations No PT follow up    Equipment Recommendations  None recommended by PT    Recommendations for Other Services       Precautions / Restrictions Precautions Precautions: Back;Fall Precaution Booklet Issued: Yes (comment) Precaution Comments: Spinal precautions Required Braces or Orthoses: Spinal Brace Spinal Brace: Lumbar corset;Applied in standing position Restrictions Weight Bearing Restrictions: No      Mobility  Bed Mobility Overal bed mobility: Modified Independent             General bed mobility comments: log roll, HOB slightly elevated    Transfers Overall transfer level: Independent Equipment used: None             General transfer comment: Pt completed sit<>stand from bed with Mod I for increased time and no LOB while in standing.  Ambulation/Gait Ambulation/Gait assistance: Independent Gait Distance (Feet): 300 Feet Assistive device: None Gait Pattern/deviations: WFL(Within Functional Limits) Gait velocity: functional Gait velocity  interpretation: >2.62 ft/sec, indicative of community ambulatory General Gait Details: steady step-through gait  Stairs Stairs: Yes Stairs assistance: Independent Stair Management: No rails;Forwards Number of Stairs: 1 General stair comments: small 3' step to simulate threshold  Wheelchair Mobility    Modified Rankin (Stroke Patients Only)       Balance Overall balance assessment: Independent                                           Pertinent Vitals/Pain Pain Assessment: No/denies pain    Home Living Family/patient expects to be discharged to:: Private residence Living Arrangements: Spouse/significant other Available Help at Discharge: Family;Available PRN/intermittently Type of Home: House Home Access: Stairs to enter Entrance Stairs-Rails: None Entrance Stairs-Number of Steps: 1 Home Layout: One level Home Equipment: Shower seat;Hand held shower head;Grab bars - toilet;Grab bars - tub/shower;Walker - 2 wheels      Prior Function Level of Independence: Independent         Comments: Pt reports boyfriend can assist her. He works during the day.     Hand Dominance        Extremity/Trunk Assessment   Upper Extremity Assessment Upper Extremity Assessment: Overall WFL for tasks assessed    Lower Extremity Assessment Lower Extremity Assessment: Overall WFL for tasks assessed    Cervical / Trunk Assessment Cervical / Trunk Assessment: Other exceptions Cervical / Trunk Exceptions: s/p lumbar surgery  Communication   Communication: No difficulties  Cognition Arousal/Alertness: Awake/alert Behavior During Therapy: WFL for tasks assessed/performed Overall Cognitive Status: Within Functional Limits for tasks assessed  General Comments: Pt impulsive and needed min verbal cues to adhere to precautions while getting dressed.      General Comments General comments (skin integrity, edema, etc.):  VSS on RA    Exercises     Assessment/Plan    PT Assessment Patent does not need any further PT services  PT Problem List         PT Treatment Interventions      PT Goals (Current goals can be found in the Care Plan section)  Acute Rehab PT Goals Patient Stated Goal: To go home    Frequency     Barriers to discharge        Co-evaluation               AM-PAC PT "6 Clicks" Mobility  Outcome Measure Help needed turning from your back to your side while in a flat bed without using bedrails?: None Help needed moving from lying on your back to sitting on the side of a flat bed without using bedrails?: None Help needed moving to and from a bed to a chair (including a wheelchair)?: None Help needed standing up from a chair using your arms (e.g., wheelchair or bedside chair)?: None Help needed to walk in hospital room?: None Help needed climbing 3-5 steps with a railing? : None 6 Click Score: 24    End of Session   Activity Tolerance: Patient tolerated treatment well Patient left: in bed;with call bell/phone within reach Nurse Communication: Mobility status PT Visit Diagnosis: Other abnormalities of gait and mobility (R26.89)    Time: 4481-8563 PT Time Calculation (min) (ACUTE ONLY): 12 min   Charges:   PT Evaluation $PT Eval Low Complexity: 1 Low          Arlyss Gandy, PT, DPT Acute Rehabilitation Pager: [phone]   Arlyss Gandy 01/18/2021, 10:27 AM

## 2021-01-18 NOTE — Discharge Instructions (Signed)
Wound Care Leave incision open to air. You may shower. Do not scrub directly on incision.  Do not put any creams, lotions, or ointments on incision. Activity Walk each and every day, increasing distance each day. No lifting greater than 5 lbs.  Avoid bending, arching, and twisting. No driving for 2 weeks; may ride as a passenger locally. If provided with back brace, wear when out of bed.  It is not necessary to wear in bed. Diet Resume your normal diet.  Return to Work Will be discussed at you follow up appointment. Call Your Doctor If Any of These Occur Redness, drainage, or swelling at the wound.  Temperature greater than 101 degrees. Severe pain not relieved by pain medication. Incision starts to come apart. Follow Up Appt Call today for appointment in 2 weeks ([phone]) or for problems.  If you have any hardware placed in your spine, you will need an x-ray before your appointment. 

## 2021-01-18 NOTE — Evaluation (Signed)
Occupational Therapy Evaluation Patient Details Name: Haley Valdez MRN: [id number] DOB: [birth year] Today's Date: 01/18/2021   History of Present Illness 60 year old female s/p L4-5 TLIF. PMH significant for smoking, HTN, fibromyalgia, ADHD, anxiety, hep c, hypothyroidism, neuropathy, R leg fx.   Clinical Impression   PTA, pt was living with boyfriend who works during the day. Pt reported she was driving, and independent in all ADLs/IADLs. Pt currently Mod I level for all ADL tasks; exception for UB dressing, requiring supervision for adhering to precautions. Pt educated on compensatory ADL and functional mobility strategies to avoid breaking back precautions and given handout. Pt demonstrating understanding of back precautions, good dynamic standing balance, and home support. Therefore recommending no OT follow up upon discharge. All OT needs met and will sign-off.     Recommendations for follow up therapy are one component of a multi-disciplinary discharge planning process, led by the attending physician.  Recommendations may be updated based on patient status, additional functional criteria and insurance authorization.   Follow Up Recommendations  No OT follow up    Equipment Recommendations  None recommended by OT    Recommendations for Other Services       Precautions / Restrictions Precautions Precautions: Back;Fall Precaution Booklet Issued: Yes (comment) Precaution Comments: Spinal precautions Required Braces or Orthoses: Spinal Brace Spinal Brace: Lumbar corset Restrictions Weight Bearing Restrictions: No      Mobility Bed Mobility Overal bed mobility: Modified Independent             General bed mobility comments: Pt demonstrated log-rolling technique with no cueing    Transfers Overall transfer level: Modified independent Equipment used: None             General transfer comment: Pt completed sit<>stand from bed with Mod I for increased time and no LOB  while in standing.    Balance  Modified Independent;History of falls.                                         ADL either performed or assessed with clinical judgement   ADL Overall ADL's : Needs assistance/impaired                                       General ADL Comments: Pt Mod I for all self care tasks; exception required supervision for UB dressing with min verbal cues needed to not twist while gathering clothes. Pt demonstrating good understanding of back precautions otherwise. Pt educated on compensatory techniques to adhere to precautions during ADLs and functional mobility, given handout.     Vision Baseline Vision/History: 0 No visual deficits Ability to See in Adequate Light: 0 Adequate Patient Visual Report: No change from baseline Vision Assessment?: No apparent visual deficits     Perception     Praxis      Pertinent Vitals/Pain Pain Assessment: No/denies pain     Hand Dominance     Extremity/Trunk Assessment Upper Extremity Assessment Upper Extremity Assessment: Overall WFL for tasks assessed   Lower Extremity Assessment Lower Extremity Assessment: Defer to PT evaluation   Cervical / Trunk Assessment Cervical / Trunk Assessment: Other exceptions Cervical / Trunk Exceptions: s/p lumbar surgery   Communication Communication Communication: No difficulties   Cognition Arousal/Alertness: Awake/alert Behavior During Therapy: Impulsive;WFL for tasks assessed/performed Overall Cognitive Status:  Within Functional Limits for tasks assessed                                 General Comments: Pt impulsive and needed min verbal cues to adhere to precautions while getting dressed.   General Comments  Slightly impulsive    Exercises     Shoulder Instructions      Home Living Family/patient expects to be discharged to:: Private residence Living Arrangements: Spouse/significant other Available Help at Discharge:  Family;Available PRN/intermittently Type of Home: House Home Access: Level entry     Home Layout: One level     Bathroom Shower/Tub: Occupational psychologist: Standard     Home Equipment: Shower seat;Hand held shower head          Prior Functioning/Environment Level of Independence: Independent        Comments: Pt reports boyfriend can assist her. He works during the day.        OT Problem List: Decreased strength;Decreased range of motion;Decreased knowledge of precautions      OT Treatment/Interventions:      OT Goals(Current goals can be found in the care plan section) Acute Rehab OT Goals Patient Stated Goal: To go home OT Goal Formulation: With patient Time For Goal Achievement: 02/01/21 Potential to Achieve Goals: Good  OT Frequency:     Barriers to D/C:            Co-evaluation              AM-PAC OT "6 Clicks" Daily Activity     Outcome Measure Help from another person eating meals?: None Help from another person taking care of personal grooming?: None Help from another person toileting, which includes using toliet, bedpan, or urinal?: None Help from another person bathing (including washing, rinsing, drying)?: None Help from another person to put on and taking off regular upper body clothing?: A Little Help from another person to put on and taking off regular lower body clothing?: None 6 Click Score: 23   End of Session Equipment Utilized During Treatment: Back brace Nurse Communication: Mobility status  Activity Tolerance: Patient tolerated treatment well;No increased pain Patient left: in bed;with call bell/phone within reach;with bed alarm set  OT Visit Diagnosis: Muscle weakness (generalized) (M62.81);Pain;History of falling (Z91.81)                Time: 5848-3507 OT Time Calculation (min): 20 min Charges:  OT General Charges $OT Visit: 1 Visit OT Evaluation $OT Eval Low Complexity: 1 Low  Jackquline Denmark, OTS Acute  Rehab Office: [phone]   Haley Valdez 01/18/2021, 9:23 AM

## 2021-01-18 NOTE — Progress Notes (Signed)
Patient was transported via wheelchair by volunteer for discharge home; in no acute distress nor complaints of pain nor discomfort; JP drain was removed and incision was re-dressed with honeycomb dressing; room was checked and accounted for all her belongings; discharge instructions given to patient by RN and she verbalized understanding on the instructions given.

## 2021-01-18 NOTE — Discharge Summary (Signed)
Physician Discharge Summary  Patient ID: Haley Valdez MRN: [id number] DOB/AGE: 1961/01/10 60 y.o.  Admit date: 01/17/2021 Discharge date: 01/18/2021  Admission Diagnoses:  Lumbar spondylolisthesis  Discharge Diagnoses:  Same Active Problems:   Spondylolisthesis of lumbar region   Discharged Condition: Stable  Hospital Course:  Haley Valdez is a 60 y.o. female admitted after uncomplicated L4-5 fusion. She was at baseline postop, ambulating well, tolerating diet, voiding normally with pain controlled.  Treatments: Surgery - L4-5 TLIF  Discharge Exam: Blood pressure 122/84, pulse (!) 57, temperature 98.3 F (36.8 C), temperature source Oral, resp. rate 18, height 5\' 7"  (1.702 m), weight 69.4 kg, SpO2 95 %. Awake, alert, oriented Speech fluent, appropriate CN grossly intact 5/5 BUE/BLE Wound c/d/i  Disposition: Discharge disposition: 01-Home or Self Care       Discharge Instructions     Call MD for:  redness, tenderness, or signs of infection (pain, swelling, redness, odor or green/yellow discharge around incision site)   Complete by: As directed    Call MD for:  temperature >100.4   Complete by: As directed    Diet - low sodium heart healthy   Complete by: As directed    Discharge instructions   Complete by: As directed    Walk at home as much as possible, at least 4 times / day   Incentive spirometry RT   Complete by: As directed    Increase activity slowly   Complete by: As directed    Lifting restrictions   Complete by: As directed    No lifting > 10 lbs   May shower / Bathe   Complete by: As directed    48 hours after surgery   May walk up steps   Complete by: As directed    Other Restrictions   Complete by: As directed    No bending/twisting at waist   Remove dressing in 48 hours   Complete by: As directed       Allergies as of 01/18/2021       Reactions   Nitrofurantoin Itching   Redness in feet   Wasp Venom Protein Other (See Comments)    cellutlitis        Medication List     TAKE these medications    amLODipine 2.5 MG tablet Commonly known as: NORVASC Take 2.5 mg by mouth daily.   amphetamine-dextroamphetamine 20 MG tablet Commonly known as: ADDERALL Take 10-20 mg by mouth daily.   atorvastatin 20 MG tablet Commonly known as: LIPITOR Take 20 mg by mouth daily.   BLINK TEARS OP Place 1 drop into both eyes daily as needed (dry eyes).   CALCIUM-MAGNESIUM-ZINC PO Take 1 tablet by mouth daily.   DULoxetine 60 MG capsule Commonly known as: CYMBALTA Take 1 capsule (60 mg total) by mouth daily. What changed: how much to take   estradiol 0.1 MG/GM vaginal cream Commonly known as: ESTRACE Place 1 Applicatorful vaginally once a week.   gabapentin 300 MG capsule Commonly known as: NEURONTIN Take 1 capsule (300 mg total) by mouth 3 (three) times daily. What changed:  how much to take when to take this   levothyroxine 112 MCG tablet Commonly known as: SYNTHROID Take 112 mcg by mouth daily before breakfast.   liothyronine 5 MCG tablet Commonly known as: CYTOMEL Take 2 tablets before breakfast and 1 tablet before dinner What changed:  how much to take how to take this when to take this additional instructions   LORazepam 2 MG tablet Commonly known  as: ATIVAN Take 1-2 mg by mouth 3 (three) times daily as needed for anxiety.   oxyCODONE 5 MG immediate release tablet Commonly known as: Oxy IR/ROXICODONE Take 1-2 tablets (5-10 mg total) by mouth every 3 (three) hours as needed for severe pain or moderate pain ((score 4 to 6)).   oxyCODONE 10 mg 12 hr tablet Commonly known as: OXYCONTIN Take 1 tablet (10 mg total) by mouth every 12 (twelve) hours.   traZODone 100 MG tablet Commonly known as: DESYREL Take 100 mg by mouth at bedtime.         SignedJackelyn Hoehn 01/18/2021, 9:43 AM

## 2021-01-21 ENCOUNTER — Ambulatory Visit (INDEPENDENT_AMBULATORY_CARE_PROVIDER_SITE_OTHER): Payer: Medicare HMO | Admitting: Psychiatry

## 2021-01-21 DIAGNOSIS — F3341 Major depressive disorder, recurrent, in partial remission: Secondary | ICD-10-CM

## 2021-01-21 DIAGNOSIS — G6181 Chronic inflammatory demyelinating polyneuritis: Secondary | ICD-10-CM

## 2021-01-21 DIAGNOSIS — F411 Generalized anxiety disorder: Secondary | ICD-10-CM

## 2021-01-21 DIAGNOSIS — Z638 Other specified problems related to primary support group: Secondary | ICD-10-CM | POA: Diagnosis not present

## 2021-01-21 DIAGNOSIS — Z62820 Parent-biological child conflict: Secondary | ICD-10-CM | POA: Diagnosis not present

## 2021-01-21 DIAGNOSIS — M48062 Spinal stenosis, lumbar region with neurogenic claudication: Secondary | ICD-10-CM

## 2021-01-21 NOTE — Progress Notes (Signed)
Psychotherapy Progress Note [facility] Haley Moore, PhD LP  Patient ID: Haley Valdez Valdez     MRN: [id number] Therapy format: Individual psychotherapy Date: 01/21/2021      Start: 2:25p     Stop: 3:10p     Time Spent: 45 min Location: Telehealth visit -- I connected with this patient by an approved telecommunication method (audio only), with her informed consent, and verifying identity and patient privacy.  I was located at my office and patient at her home.  As needed, we discussed the limitations, risks, and security and privacy concerns associated with telehealth service, including the availability and conditions which currently govern in-person appointments and the possibility that 3rd-party payment may not be fully guaranteed and she may be responsible for charges.  After she indicated understanding, we proceeded with the session.  Also discussed treatment planning, as needed, including ongoing verbal agreement with the plan, the opportunity to ask and answer all questions, her demonstrated understanding of instructions, and her readiness to call the office should symptoms worsen or she feels she is in a crisis state and needs more immediate and tangible assistance.   Session narrative (presenting needs, interim history, self-report of stressors and symptoms, applications of prior therapy, status changes, and interventions made in session) Delay to establish contact.  Didn't realize she had this session scheduled.  Had surgery on the 15th, more extensive work needed and postop pain worse than expected, is running through her meds faster than prescribed.  Son Haley Valdez Valdez had pledged to keep her dog, then he dropped it on her Saturday he'Haley Valdez made travel plans and wanted to drop the dog back, figuring her boyfriend could make up the difference.  Agreed it was presumptuous, but Valdez she has the backup support.  BF Haley Valdez Valdez is there because he did declare for divorce, got agreement with his  estranged wife to file for divorce (60 days in [place]).  Continues full-time work, but tends her after, leaving some needs and time to figure out, especially as dog comes back.  Friends Haley Valdez Valdez and Haley Valdez Valdez are coming over, covering some needs including dog walking.  Questioned about PT and home care, she says the hospital cancelled her social worker visit for discharge planning, on the appearance she was more independent at the hospital.  That, plus the surgeon having to leave abruptly for a family emergency rather than make final rounds, meant her discharge assessment was short-shrifted.  Has a call in to the surgeon's office to obtain further pain meds, but had not thought of checking out anything else.  Encouraged she do ask about getting assessed for home health coverage inability to fully guarantee personal support and supervision for necessary ADLs.  Re Haley Valdez Valdez, they met in June, have plans now to get married.  Trusts his character, partly for seeing him take care of her without reservation or sex.  Encouraged caution still, make sure it's not just her need for a nurse and a rebound lover and his need for new excitement and adventure to fight off depression.  For her part, Haley Valdez Valdez still estranged, but then she is a heroin addict on methadone maintenance, and trusted sister is still assumed to be against Haley Valdez Valdez and ready to perpetuate the idea she is an unrepentant drunk and manipulator.    Therapeutic modalities: Cognitive Behavioral Therapy, Solution-Oriented/Positive Psychology, and Ego-Supportive  Mental Status/Observations:  Appearance:   Not assessed     Behavior:  Appropriate  Motor:  Not assessed  Speech/Language:   Clear  and Coherent  Affect:  Not assessed  Mood:  anxious  Thought process:  normal  Thought content:    WNL  Sensory/Perceptual disturbances:    WNL  Orientation:  Fully oriented  Attention:  Good    Concentration:  Fair  Memory:  grossly intact  Insight:    Fair  Judgment:    Fair  Impulse Control:  Fair   Risk Assessment: Danger to Self: No Self-injurious Behavior: No Danger to Others: No Physical Aggression / Violence: No Duty to Warn: No Access to Firearms a concern: No  Assessment of progress:  stabilized  Diagnosis:   ICD-10-CM   1. Generalized anxiety disorder (r/o PTSD)  F41.1     2. Relationship problem between parent and child  Z62.820     3. Major depressive disorder, recurrent, in partial remission (Haley Valdez Valdez)  F33.41     4. Other family conflict  N98.9     5. Spinal stenosis of lumbar region with neurogenic claudication  M48.062     6. CIDP (chronic inflammatory demyelinating polyneuropathy) (HCC)  G61.81      Plan:  For postop care at home: [place] line if she needs to connect any other services for personal care in recovery.   Check with surgeon about homebound PT, be very clear about needs Re. pain meds, be ready to deal with restrictions, especially since she has a history of substance abuse that could be triggered.  Look, and ask, for nonpharmacological pain strategies and prioritize healthy movement as she heals For mood and wellbeing Self-affirm handling of her needs and relational abandonments Walk and stretch as appropriate and authorized Keep touch with Haley Valdez Valdez, stay clear about what asking or needed Some caution about overcommitting to this relationship Other recommendations/advice as may be noted above Continue to utilize previously learned skills ad lib Maintain medication as prescribed and work faithfully with relevant prescriber(s) if any changes are desired or seem indicated Call the clinic on-call service, 988/hotline, present to ER, or call 911 if any life-threatening psychiatric crisis Return for session(s) already scheduled. Already scheduled visit in this office 02/11/2021.  Haley Valdez Serve, PhD Haley Moore, PhD LP Clinical Psychologist, [facility] [address] [place], [place] [address] [phone]

## 2021-02-11 ENCOUNTER — Other Ambulatory Visit: Payer: Self-pay

## 2021-02-11 ENCOUNTER — Ambulatory Visit (INDEPENDENT_AMBULATORY_CARE_PROVIDER_SITE_OTHER): Payer: Medicare HMO | Admitting: Psychiatry

## 2021-02-11 DIAGNOSIS — M48062 Spinal stenosis, lumbar region with neurogenic claudication: Secondary | ICD-10-CM | POA: Diagnosis not present

## 2021-02-11 DIAGNOSIS — G6181 Chronic inflammatory demyelinating polyneuritis: Secondary | ICD-10-CM

## 2021-02-11 DIAGNOSIS — F3341 Major depressive disorder, recurrent, in partial remission: Secondary | ICD-10-CM

## 2021-02-11 DIAGNOSIS — Z62820 Parent-biological child conflict: Secondary | ICD-10-CM

## 2021-02-11 DIAGNOSIS — Z87898 Personal history of other specified conditions: Secondary | ICD-10-CM

## 2021-02-11 DIAGNOSIS — F411 Generalized anxiety disorder: Secondary | ICD-10-CM | POA: Diagnosis not present
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
## 2021-03-31 HISTORY — PX: HERNIA REPAIR: SHX51

## 2021-05-07 ENCOUNTER — Telehealth: Payer: Self-pay

## 2021-05-07 NOTE — Telephone Encounter (Signed)
Received notice from [facility] that [employer] 10%  vial was approved until 05/04/2022.

## 2021-05-08 ENCOUNTER — Telehealth: Payer: Self-pay | Admitting: *Deleted

## 2021-05-08 NOTE — Telephone Encounter (Signed)
Duplicate telephone note. See call from 05/07/21.

## 2021-05-08 NOTE — Telephone Encounter (Signed)
Received request for new prescription from [facility] (home health agency).  Her maintenance dose will be 1 gram/kg every 3-4 weeks, divided over 2 days. She will be coming in for an appt on 05/22/21 for an updated evaluation. We will get an accurate weight that day then send in new orders.   I have spoken to the pharmacist, Eduard Clos, at [facility]. She is due to be infused on 05/16/21 and 05/17/21. He has placed her order on hold for now.   I have requested a call back from the patient. We may can get her in sooner so that her medication is not so delayed.

## 2021-05-08 NOTE — Telephone Encounter (Signed)
Charlie with [facility] called stating he faxed over a referral order form, needs to be signed and faxed back. If we have not received it please call charlie at [phone].

## 2021-05-09 NOTE — Telephone Encounter (Signed)
I spoke to the patient. She has been rescheduled to see Dr. Krista Blue on 05/13/21. We will get a new weight and send in new IVIG orders after her evaluation.

## 2021-05-13 ENCOUNTER — Encounter: Payer: Self-pay | Admitting: Neurology

## 2021-05-13 ENCOUNTER — Ambulatory Visit: Payer: Medicare HMO | Admitting: Neurology

## 2021-05-14 ENCOUNTER — Ambulatory Visit: Payer: Medicare HMO | Admitting: Neurology

## 2021-05-14 ENCOUNTER — Telehealth: Payer: Self-pay | Admitting: Neurology

## 2021-05-14 VITALS — BP 138/82 | HR 76 | Ht 67.0 in | Wt 160.0 lb

## 2021-05-14 DIAGNOSIS — G6181 Chronic inflammatory demyelinating polyneuritis: Secondary | ICD-10-CM | POA: Diagnosis not present

## 2021-05-14 DIAGNOSIS — M545 Low back pain, unspecified: Secondary | ICD-10-CM | POA: Diagnosis not present

## 2021-05-14 NOTE — Progress Notes (Signed)
ASSESSMENT AND PLAN 61 y.o. year old female     Chronic demyelinating polyradiculoneuropathy.  Overall doing very well  Repeat EMG nerve conduction study  Continue home IVIG treatment, 1 g/kg, divided into 2 doses She received her first round of IVIG on June 13, 14 2019, 2 g/kg for IV infusion,  Lumbar stenosis MRI lumbar spine May 2021 showed severe stenosis at L4-5 Status post decompression in September 2022, recovering well  Neuropathic pain  Much improved Continue Cymbalta 60 mg daily,  Gabapentin 300 mg 2 tablets every night  DIAGNOSTIC DATA (LABS, IMAGING, TESTING) - I reviewed patient records, labs, notes, testing and imaging myself where available.  EMG/NCS in May 2021 This is an abnormal study.  There is evidence of chronic demyelinating polyradiculoneuropathy.  There is no evidence of axonal loss.  Compared to previous study in April 2019, there was mild to moderate improvement, as evident by increased multiple motor CMAP amplitude, improved F-wave latency, and appearance of bilateral lower extremity sensory responses.    MRI of the lumbar spine without contrast shows the following on May 8th 2021. 1.   At L4-L5, there is severe spinal stenosis due to anterolisthesis, disc protrusion, facet hypertrophy and ligamentum flavum hypertrophy.  There is moderately severe right lateral recess stenosis with probable right L5 nerve root compression.  Additionally, due to the severity of the spinal stenosis, other traversing nerve roots could be compressed. 2.   At L3-L4, there is mild to moderate spinal stenosis.  There is potential for left L4 nerve root compression. 3.   There are moderate degenerative changes at the other lumbar levels without spinal stenosis or nerve root compression..   Normal MRI thoracic spine (without) on October 29 2017   HISTORY OF PRESENT ILLNESS:  Haley Valdez is a 61 year old female, seen in refer by her primary care doctor [name].   for evaluation of transient ischemic attack, initial evaluation was on April 22, 2017.   I reviewed and summarized the referring note, she has past medical history of hypothyroidism, peripheral neuropathy, hyperlipidemia,   She works as a Copywriter, advertising for many years, around 1995, she began to experience neck pain, intermittent bilateral feet and hand paresthesia, began to seek neurological care, per patient, patient was diagnosed was carpal tunnel syndromes, peripheral neuropathy,   Then she began to develop unsteady gait,  began to fall since age 64,   She was also diagnosed with hepatitis C received treatment around 2008, around that time, she complains of excessive fatigue, generalized weakness,   In 2015, with her abnormal neck posturing, persistent neck pain, she received Botox injection in May 2015, responded very well, second injection in August 2015, has caused left neck pain, swollen, weakness, has to hold her head up with her hand   Since August 2015, she complains of frequent left-sided neck pain, radiating pain to left occipital, left parietal region, left side headaches,   She had extensive evaluations, I was able to review outside MRI cervical report October 2015, Left C3-4 facet abnormality is deep to the tender palpable  abnormality. The facet is overgrown due to degenerative change. In addition, there is bone marrow and adjacent soft tissue edema and enhancement suggesting active osteoarthritis. Underlying infection not excluded but considered less likely. Correlate with symptoms. Nevidence of discitis or abscess. There is cervical spondylosis. No other acute abnormality.  No mass or adenopathy detected.   MRI brian in Oct 2018: Mild nonspecific supratentorial small vessel  disease, no contrast enhancement,   Repeat MRI in Cervcial in June 2018.  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, with severe left foraminal stenosis, multilevel degenerative disc disease, she is now  referred to neurosurgeon, pain management for epidural injection   She continued complaints of intermittent bilateral hand and feet paresthesia, gait abnormality, but today's neurological examination are fairly normal,   Laboratory evaluations in October 2018, INR 0.9 normal CBC,   Update July 22, 2017: She is alone at today's clinical visit, pressed speech, volunteer a lot of informations, long history of ADHD, taking Adderall 10 mg daily, also on polypharmacy treatment, chronic methadone treatment 10 mg 3 times a day, also taking Ativan 1 mg every 8 hours, Cymbalta 60 mg daily, Neurontin 300 mg 3 times a day   She continue complains of neck pain, radiating pain to left shoulder, drop things from her left hand,    I was able to review the EMG nerve conduction study dated April 09, 2017 by Dr. [name], there was no evidence of left C5-6 radiculopathy, moderate carpal tunnel at the left wrist, moderate severe left ulnar neuropathy at the wrist, this is based on active neuropathic changes noted in the left deltoid, biceps, triceps, pronator teres, abductor pollicis brevis, and left cervical paraspinals,   The study showed moderately prolonged left median sensory peak latency, with severely prolonged left median motor distal latency, normal C map amplitude, mild slow conduction velocity.   She previously received the Botox injection for her neck pain, abnormal neck posturing in May again August 2015 by outside neurologist [name], [name], I was able to review injection note, 1. Bilateral Upper trapezius received 40 units each side 2. Splenius Capiti received 20 units each side 3. Longissimus 20 units each side 4. Semispinalis Capitis 20 units side   She reported since her injection in August 2015, she had a knot at left neck from injection, been persistent, continue have significant neck pain, frequent headaches,   We have personally reviewed MRI of the brain with and without contrast  in October 2018: No acute abnormality, scattered subcortical hyperintense T2 signal changes, largest is at left anterior temporal lobe, no contrast enhancement, most consistent with small vessel disease.   MRI of cervical spine multilevel degenerative changes, left facet arthritis at C3-4 with severe left foraminal stenosis, there is no significant canal stenosis.   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  There is also suggestion of temporal dispersion at bilateral tibial proximal stimulation sites.   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 on October 08, 2017, decreased TSH, there was suggestion of her thyroid supplement change, the patient did not make any changes yet,   She continue has mood swings, pressed speech, complains of bilateral feet paresthesia, gait abnormality, urinary urgency, she did have significant hyperreflexia of  bilateral patella but absent ankle reflexes, less dependent sensory changes, MRI of the brain and cervical findings would not explain her hyperreflexia, ordered MRI of thoracic spine   UPDATE November 26 2017: She had her first home IVIG infusion through [facility] on June 13, 14, a week after infusion, she reported doing well, however, about 3 weeks after the  infusion, in early July 2019 she noticed a rash broke out at the bottom of her feet, arm, dry scaly skins, there is also raised erythematous dry scaly rash at bilateral leg, she reported similar reaction to interferon for hepatitis C treatment in the past, she was seen by her dermatologist Dr.Walter Elvera Lennox in recent few months, was given the diagnosis of eczema, given a prescription of dexamethasone cream.   Patient stated, [facility] home infusion agent is planning on switching from Octgam to different 10% IVIG privigen, she complains of excessive stress, depression, " she feel manner just want to stay in bed all the time"    IVIG infusion in June did help her some, she feel more energetic,  Less lower extremity spasticity, paresthesia,   MRI of thoracic spine on October 29, 2017 was normal   UPDATE Apr 22 2018: She continued her IVIG infusion, did notice less fatigue, lessening paresthesia and pain, she is going through a lot of stress, complains of worsening anxiety, she wished to continue IVIG treatment, but has to switch to a different provider in [facility] system for better coverage from her insurance company,   Update June 01, 2019: She has lost follow-up since last visit in December 2019 due to health insurance, she is on thyroid supplement now, she had a history of chronic diffuse body achy pain, fibromyalgia, was under pain management, also see psychiatrist, taking Cymbalta 60 mg daily, gabapentin 300 mg 3 times a day, methadone 10 mg every 8 hours, Adderall 10 mg daily   She has been receiving her IVIG through home health on a monthly basis, she continues to report improvement, continue has mild gait abnormality, especially when closing her eyes   She was diagnosed with hand and feet psoriasis,   Laboratory evaluation from [facility] system in September 2020, normal hemoglobin of 12.8, elevated triglyceride 509, total cholesterol 205, normal CMP, creatinine of 0.96, UDS was positive for  methadone, amphetamine,   UPDATE July 13 2019: Patient has been receiving IVIG treatment on a monthly basis, she is doing very well, continue have mild improvement, but continue complains of bilateral upper and lower extremity paresthesia, sometimes achy pain   She had a history of chronic low back pain, worsening since September 2020, now complains of frequent radiating pain to bilateral lower extremity, difficult to sit still, or standing for too long, also has urinary urgency   She return for electrodiagnostic study today, which continues show evidence of demyelinating polyradiculoneuropathy, but mild improvement compared to previous EMG study in April 2019     UPDATE October 13 2019: She continue to improve with IVIG treatment, she tolerated treatment well, with each IVIG infusion, she felt the last bilateral upper and lower extremity paresthesia and neuropathic pain, not improved gait abnormality, she does complains of worsening low back pain, electrodiagnostic study in March 2021 showed evidence of chronic bilateral lumbosacral radiculopathy changes She also complains of urinary urgency, frequency, occasionally stress incontinence We personally reviewed MRI of lumbar spine in May 2021, evidence of severe spinal stenosis at L4-5 due to anterolisthesis, disc protrusion, facet hypertrophy and ligamentum flavum hypertrophy.  There is moderately  severe right lateral recess stenosis with probable right L5 nerve root compression.  Additionally, due to the severity of the spinal stenosis, other traversing nerve roots could be compressed.; L3-L4, there is mild to moderate spinal stenosis.  There is potential for left L4 nerve root compression. There are moderate degenerative changes at the other lumbar levels without spinal stenosis or nerve root compression.  UPDATE June 15th 2022: Her bilateral feet lower extremity burning pain, discoloration has much improved with continued IVIG treatment, 1 g/kg  every 3 to 4 weeks through home health,  The most bothersome symptom for her is worsening lower extremity pain, radiating pain to bilateral lower extremity, recent few months, she complains 24/7 10 out of 10 severe pain,  She is also going through a lot of stress, just separated from her boyfriend of 10 years, tearful during today's interview, taking Cymbalta 60 mg daily which is helpful, previously gabapentin was helpful, she worried about her difficulty concentrating, contributed to gabapentin, stopped gabapentin without advise, noticed worsening pain,  In addition, she complains of worsening urinary frequency, incontinence, was seen by neurosurgeon in the past, deemed to be a surgical candidate," I can no longer tolerate the pain, needs to have surgery now"  UPDATE May 14 2020: Haley Valdez underwent lumbar decompression surgery in September 2022, recovering well, no longer have significant low back pain, continue have bilateral feet numbness, no longer have significant tingling or burning pain, is on Cymbalta 60 mg daily, gabapentin 300 mg 2 tablets every night,  She does complains of fatigue at home IVIG treatment, wondering if needs long-term treatment  PHYSICAL EXAM  Vitals:   10/17/20 1116  BP: (!) 145/87  Pulse: 77  Weight: 153 lb 8 oz (69.6 kg)  Height: 5' 7" (1.702 m)   Body mass index is 24.04 kg/m.  Generalized: Well developed, in no acute distress   PHYSICAL EXAMNIATION:  Gen: NAD, conversant, well nourised, well groomed                     Cardiovascular: Regular rate rhythm, no peripheral edema, warm, nontender. Eyes: Conjunctivae clear without exudates or hemorrhage Neck: Supple, no carotid bruits. Pulmonary: Clear to auscultation bilaterally   NEUROLOGICAL EXAM:  MENTAL STATUS: Speech/cognition: Awake, alert, oriented to history taking and casual conversation.    CRANIAL NERVES: CN II: Visual fields are full to confrontation.  Pupils are round equal and briskly  reactive to light. CN III, IV, VI: extraocular movement are normal. No ptosis. CN V: Facial sensation is intact to pinprick in all 3 divisions bilaterally.  CN VII: Face is symmetric with normal eye closure and smile. CN VIII: Hearing is normal to casual conversation CN IX, X: Palate elevates symmetrically. Phonation is normal. CN XI: Head turning and shoulder shrug are intact CN XII: Tongue is midline with normal movements and no atrophy.  MOTOR: There is no pronator drift of out-stretched arms. Muscle bulk and tone are normal. Muscle strength is normal.  REFLEXES: Reflexes are 2+ and symmetric at the biceps, triceps, knees, and trace ankles. Plantar responses are flexor.  SENSORY: Intact to light touch, pinprick, positional and vibratory sensation are intact in fingers and toes.  COORDINATION: There is no dysmetria on finger-to-nose and heel-knee-shin.    GAIT/STANCE: She can get up from sitting position arm crossed, steady.   REVIEW OF SYSTEMS: Out of a complete 14 system review of symptoms, the patient complains only of the following symptoms, and all other reviewed systems are negative.  Fatigue  ALLERGIES: Allergies  Allergen Reactions   Nitrofurantoin Itching    Redness in feet    Wasp Venom Protein Other (See Comments)    cellutlitis    HOME MEDICATIONS: Outpatient Medications Prior to Visit  Medication Sig Dispense Refill   amLODipine (NORVASC) 2.5 MG tablet Take 2.5 mg by mouth daily.     amphetamine-dextroamphetamine (ADDERALL) 20 MG tablet Take 10-20 mg by mouth daily.     atorvastatin (LIPITOR) 20 MG tablet Take 20 mg by mouth daily.     CALCIUM-MAGNESIUM-ZINC PO Take 1 tablet by mouth daily.     DULoxetine (CYMBALTA) 60 MG capsule Take 1 capsule (60 mg total) by mouth daily. (Patient taking differently: Take 120 mg by mouth daily.) 90 capsule 4   estradiol (ESTRACE) 0.1 MG/GM vaginal cream Place 1 Applicatorful vaginally once a week.     gabapentin  (NEURONTIN) 300 MG capsule Take 1 capsule (300 mg total) by mouth 3 (three) times daily. (Patient taking differently: Take 600 mg by mouth at bedtime.) 270 capsule 3   levothyroxine (SYNTHROID) 112 MCG tablet Take 112 mcg by mouth daily before breakfast.     liothyronine (CYTOMEL) 5 MCG tablet Take 2 tablets before breakfast and 1 tablet before dinner (Patient taking differently: Take 7 mcg by mouth daily.) 270 tablet 0   LORazepam (ATIVAN) 2 MG tablet Take 1-2 mg by mouth 3 (three) times daily as needed for anxiety.     oxyCODONE (OXY IR/ROXICODONE) 5 MG immediate release tablet Take 1-2 tablets (5-10 mg total) by mouth every 3 (three) hours as needed for severe pain or moderate pain ((score 4 to 6)). 30 tablet 0   oxyCODONE (OXYCONTIN) 10 mg 12 hr tablet Take 1 tablet (10 mg total) by mouth every 12 (twelve) hours. 14 tablet 0   Polyethylene Glycol 400 (BLINK TEARS OP) Place 1 drop into both eyes daily as needed (dry eyes).     traZODone (DESYREL) 100 MG tablet Take 100 mg by mouth at bedtime.     No facility-administered medications prior to visit.    PAST MEDICAL HISTORY: Past Medical History:  Diagnosis Date   ADHD    Anxiety 2012   Chronic neck pain    Depression    Fibromyalgia    Hepatitis 2008   Hyperlipemia    Hypertension 2022   Hypothyroidism 1989   Neuropathy    chronic demyelinating polyradiculoneuropathy, s/p IVIG   Pneumonia 2012   Raynaud's disease    Thyroid disease     PAST SURGICAL HISTORY: Past Surgical History:  Procedure Laterality Date   arm surgery Right    Fracture   CESAREAN SECTION     LEG SURGERY Right    Fracture   TRANSFORAMINAL LUMBAR INTERBODY FUSION (TLIF) WITH PEDICLE SCREW FIXATION 1 LEVEL N/A 01/17/2021   Procedure: Transforaminal Lumbar Interbody Fusion Lumbar Four- Five;  Surgeon: Vallarie Mare, MD;  Location: [facility];  Service: Neurosurgery;  Laterality: N/A;   TUBAL LIGATION      FAMILY HISTORY: Family History  Problem Relation  Age of Onset   Heart attack Mother    Thyroid disease Mother    Stroke Father    Thyroid disease Sister     SOCIAL HISTORY: Social History   Socioeconomic History   Marital status: Single    Spouse name: Not on file   Number of children: 2   Years of education: 14   Highest education level: Associate degree: occupational, Hotel manager, or vocational program  Occupational  History   Occupation: Designer, multimedia  Tobacco Use   Smoking status: Every Day    Packs/day: 1.00    Types: Cigarettes   Smokeless tobacco: Never  Vaping Use   Vaping Use: Never used  Substance and Sexual Activity   Alcohol use: Yes    Alcohol/week: 1.0 standard drink    Types: 1 Shots of liquor per week    Comment: 1 margarita a night   Drug use: Yes    Types: Marijuana    Comment: smokes daily   Sexual activity: Not on file  Other Topics Concern   Not on file  Social History Narrative   Lives at home with her boyfriend.   Right-handed.   1 cup caffeine per day.   Social Determinants of Health   Financial Resource Strain: Not on file  Food Insecurity: Not on file  Transportation Needs: Not on file  Physical Activity: Not on file  Stress: Not on file  Social Connections: Not on file  Intimate Partner Violence: Not on file   Marcial Pacas, M.D. Ph.D.  [facility] [place], [place] [address] Phone: [phone] Fax:      [phone]

## 2021-05-14 NOTE — Telephone Encounter (Signed)
Spoke to pharmacy  Verified that Dr Krista Blue did reduce med to 70g total/2 day All questions answered

## 2021-05-14 NOTE — Progress Notes (Signed)
ASSESSMENT AND PLAN 61 y.o. year old female  has a past medical history of ADHD, Chronic neck pain, Depression, Fibromyalgia, Hyperlipemia, Neuropathy, Raynaud's disease, and Thyroid disease. here with:  Chronic demyelinating polyradiculoneuropathy. -Overall stable, continues to benefit from IVIG in terms of muscle strength and gait  -Repeat EMG nerve conduction study on July 13, 2019, continue showed features of demyelinating neuropathy, evident by prolonged distal latency, slow conduction velocity.  Prolonged F-wave latency,  -Spinal fluid testing in 2019 showed total protein of 110, cytoalbumin dissociation, WBC of 0.   -She received her first round of IVIG on June 13, 14 2019, 2 g/kg for IV infusion, maintenance dose 1 g/kg through home infusion since, reported mild to moderate improvement   -The atypical features is well preserved reflexes of bilateral upper extremity and patella, there was no significant pathology on previous MRI of the brain, cervical, thoracic spine.  -Continue IVIG 1 g/kg, she is getting it through home health, every 3 to 4 weeks Worsening low back pain, radiating pain to bilateral lower extremity -MRI lumbar spine May 2021 showed severe stenosis at L4-5 Referred to neurosurgery again for decompression surgery  Neuropathic pain Continue Cymbalta 60 mg daily, previously she was on gabapentin which has helped her neuropathic pain, worry about the side effect of mental slowing, she has stopped taking gabapentin, she is going through a lot of stress, just separated from her boyfriend of 88 years old, tearful at today's clinical visit, restart gabapentin up to 900 mg daily, prefer higher dose at night to help her sleep,   DIAGNOSTIC DATA (LABS, IMAGING, TESTING) - I reviewed patient records, labs, notes, testing and imaging myself where available.  EMG/NCS in May 2021 This is an abnormal study.  There is evidence of chronic demyelinating polyradiculoneuropathy.   There is no evidence of axonal loss.  Compared to previous study in April 2019, there was mild to moderate improvement, as evident by increased multiple motor CMAP amplitude, improved F-wave latency, and appearance of bilateral lower extremity sensory responses.    MRI of the lumbar spine without contrast shows the following on May 8th 2021. 1.   At L4-L5, there is severe spinal stenosis due to anterolisthesis, disc protrusion, facet hypertrophy and ligamentum flavum hypertrophy.  There is moderately severe right lateral recess stenosis with probable right L5 nerve root compression.  Additionally, due to the severity of the spinal stenosis, other traversing nerve roots could be compressed. 2.   At L3-L4, there is mild to moderate spinal stenosis.  There is potential for left L4 nerve root compression. 3.   There are moderate degenerative changes at the other lumbar levels without spinal stenosis or nerve root compression..   Normal MRI thoracic spine (without) on October 29 2017   HISTORY OF PRESENT ILLNESS:  Haley Valdez is a 61 year old female, seen in refer by her primary care doctor Haley Valdez.  for evaluation of transient ischemic attack, initial evaluation was on April 22, 2017.   I reviewed and summarized the referring note, she has past medical history of hypothyroidism, peripheral neuropathy, hyperlipidemia,   She works as a Copywriter, advertising for many years, around 1995, she began to experience neck pain, intermittent bilateral feet and hand paresthesia, began to seek neurological care, per patient, patient was diagnosed was carpal tunnel syndromes, peripheral neuropathy,   Then she began to develop unsteady gait,  began to fall since age 56,   She was also diagnosed with hepatitis C received treatment  around 2008, around that time, she complains of excessive fatigue, generalized weakness,   In 2015, with her abnormal neck posturing, persistent neck pain, she received  Botox injection in May 2015, responded very well, second injection in August 2015, has caused left neck pain, swollen, weakness, has to hold her head up with her hand   Since August 2015, she complains of frequent left-sided neck pain, radiating pain to left occipital, left parietal region, left side headaches,   She had extensive evaluations, I was able to review outside MRI cervical report October 2015, Left C3-4 facet abnormality is deep to the tender palpable  abnormality. The facet is overgrown due to degenerative change. In addition, there is bone marrow and adjacent soft tissue edema and enhancement suggesting active osteoarthritis. Underlying infection not excluded but considered less likely. Correlate with symptoms. Nevidence of discitis or abscess. There is cervical spondylosis. No other acute abnormality.  No mass or adenopathy detected.   MRI brian in Oct 2018: Mild nonspecific supratentorial small vessel disease, no contrast enhancement,   Repeat MRI in Cervcial in June 2018.  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, with severe left foraminal stenosis, multilevel degenerative disc disease, she is now referred to neurosurgeon, pain management for epidural injection   She continued complaints of intermittent bilateral hand and feet paresthesia, gait abnormality, but today's neurological examination are fairly normal,   Laboratory evaluations in October 2018, INR 0.9 normal CBC,   Update July 22, 2017: She is alone at today's clinical visit, pressed speech, volunteer a lot of informations, long history of ADHD, taking Adderall 10 mg daily, also on polypharmacy treatment, chronic methadone treatment 10 mg 3 times a day, also taking Ativan 1 mg every 8 hours, Cymbalta 60 mg daily, Neurontin 300 mg 3 times a day   She continue complains of neck pain, radiating pain to left shoulder, drop things from her left hand,    I was able to review the EMG nerve conduction study dated April 09, 2017 by Dr. Lynden Valdez, there was no evidence of left C5-6 radiculopathy, moderate carpal tunnel at the left wrist, moderate severe left ulnar neuropathy at the wrist, this is based on active neuropathic changes noted in the left deltoid, biceps, triceps, pronator teres, abductor pollicis brevis, and left cervical paraspinals,   The study showed moderately prolonged left median sensory peak latency, with severely prolonged left median motor distal latency, normal C map amplitude, mild slow conduction velocity.   She previously received the Botox injection for her neck pain, abnormal neck posturing in May again August 2015 by outside neurologist [name], [name], I was able to review injection note, 1. Bilateral Upper trapezius received 40 units each side 2. Splenius Capiti received 20 units each side 3. Longissimus 20 units each side 4. Semispinalis Capitis 20 units side   She reported since her injection in August 2015, she had a knot at left neck from injection, been persistent, continue have significant neck pain, frequent headaches,   We have personally reviewed MRI of the brain with and without contrast in October 2018: No acute abnormality, scattered subcortical hyperintense T2 signal changes, largest is at left anterior temporal lobe, no contrast enhancement, most consistent with small vessel disease.   MRI of cervical spine multilevel degenerative changes, left facet arthritis at C3-4 with severe left foraminal stenosis, there is no significant canal stenosis.   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  There is also suggestion of temporal dispersion at bilateral tibial proximal stimulation sites.   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 on October 08, 2017, decreased TSH, there was suggestion of her thyroid supplement change, the patient did not make any changes yet,   She continue has mood swings, pressed speech, complains of bilateral feet paresthesia, gait abnormality, urinary urgency, she did have significant hyperreflexia of bilateral patella but absent ankle reflexes, less dependent sensory changes, MRI of the brain and cervical findings would not explain her hyperreflexia, ordered MRI of thoracic spine   UPDATE November 26 2017: She had her first home IVIG infusion through [facility] on June 13, 14, a week after infusion, she reported doing well, however, about 3 weeks after the infusion, in early July 2019 she noticed a rash broke out at the bottom of her feet, arm, dry scaly skins, there is also raised erythematous dry scaly rash at bilateral leg, she reported similar reaction to interferon for hepatitis C treatment in the past, she was seen by her dermatologist Dr.Walter Elvera Lennox in recent few months, was given the diagnosis of eczema, given a prescription of dexamethasone cream.   Patient stated, [facility] home infusion agent is planning on switching from Octgam to different 10% IVIG privigen, she complains of excessive stress, depression, " she feel manner just want to stay in bed all the time"    IVIG infusion in June did help her some, she feel more energetic,  Less lower extremity spasticity, paresthesia,   MRI of thoracic spine on October 29, 2017 was normal    UPDATE Apr 22 2018: She continued her IVIG infusion, did notice less fatigue, lessening paresthesia and pain, she is going through a lot of stress, complains of worsening anxiety, she wished to continue IVIG treatment, but has to switch to a different provider in [facility] system for better coverage from her insurance company,   Update June 01, 2019: She has lost follow-up since last visit in December 2019 due to health insurance, she is on thyroid supplement now, she had a history of chronic diffuse body achy pain, fibromyalgia, was under pain management, also see psychiatrist, taking Cymbalta 60 mg daily, gabapentin 300 mg 3 times a day, methadone 10 mg every 8 hours, Adderall 10 mg daily   She has been receiving her IVIG through home health on a monthly basis, she continues to report improvement, continue has mild gait abnormality, especially when closing her eyes   She was diagnosed with hand and feet psoriasis,   Laboratory evaluation from [facility] system in September 2020, normal hemoglobin of 12.8, elevated triglyceride 509, total cholesterol 205, normal CMP, creatinine of 0.96, UDS was positive for methadone, amphetamine,   UPDATE July 13 2019: Patient has been receiving IVIG treatment on a monthly basis, she is doing very well, continue have mild improvement, but continue complains of bilateral upper and  lower extremity paresthesia, sometimes achy pain   She had a history of chronic low back pain, worsening since September 2020, now complains of frequent radiating pain to bilateral lower extremity, difficult to sit still, or standing for too long, also has urinary urgency   She return for electrodiagnostic study today, which continues show evidence of demyelinating polyradiculoneuropathy, but mild improvement compared to previous EMG study in April 2019     UPDATE October 13 2019: She continue to improve with IVIG treatment, she tolerated treatment well, with each IVIG infusion, she  felt the last bilateral upper and lower extremity paresthesia and neuropathic pain, not improved gait abnormality, she does complains of worsening low back pain, electrodiagnostic study in March 2021 showed evidence of chronic bilateral lumbosacral radiculopathy changes She also complains of urinary urgency, frequency, occasionally stress incontinence We personally reviewed MRI of lumbar spine in May 2021, evidence of severe spinal stenosis at L4-5 due to anterolisthesis, disc protrusion, facet hypertrophy and ligamentum flavum hypertrophy.  There is moderately severe right lateral recess stenosis with probable right L5 nerve root compression.  Additionally, due to the severity of the spinal stenosis, other traversing nerve roots could be compressed.; L3-L4, there is mild to moderate spinal stenosis.  There is potential for left L4 nerve root compression. There are moderate degenerative changes at the other lumbar levels without spinal stenosis or nerve root compression.  UPDATE June 15th 2022: Her bilateral feet lower extremity burning pain, discoloration has much improved with continued IVIG treatment, 1 g/kg every 3 to 4 weeks through home health,  The most bothersome symptom for her is worsening lower extremity pain, radiating pain to bilateral lower extremity, recent few months, she complains 24/7 10 out of 10 severe pain,  She is also going through a lot of stress, just separated from her boyfriend of 10 years, tearful during today's interview, taking Cymbalta 60 mg daily which is helpful, previously gabapentin was helpful, she worried about her difficulty concentrating, contributed to gabapentin, stopped gabapentin without advise, noticed worsening pain,  In addition, she complains of worsening urinary frequency, incontinence, was seen by neurosurgeon in the past, deemed to be a surgical candidate," I can no longer tolerate the pain, needs to have surgery now"  PHYSICAL EXAM  Vitals:    10/17/20 1116  BP: (!) 145/87  Pulse: 77  Weight: 153 lb 8 oz (69.6 kg)  Height: '5\' 7"'  (1.702 m)   Body mass index is 24.04 kg/m.  Generalized: Well developed, in no acute distress   PHYSICAL EXAMNIATION:  Gen: NAD, conversant, well nourised, well groomed                     Cardiovascular: Regular rate rhythm, no peripheral edema, warm, nontender. Eyes: Conjunctivae clear without exudates or hemorrhage Neck: Supple, no carotid bruits. Pulmonary: Clear to auscultation bilaterally   NEUROLOGICAL EXAM:  MENTAL STATUS: Speech:    Speech is normal; fluent and spontaneous with normal comprehension.  Cognition:     Orientation to time, place and person     Normal recent and remote memory     Normal Attention span and concentration     Normal Language, naming, repeating,spontaneous speech     Fund of knowledge   CRANIAL NERVES: CN II: Visual fields are full to confrontation.  Pupils are round equal and briskly reactive to light. CN III, IV, VI: extraocular movement are normal. No ptosis. CN V: Facial sensation is intact to pinprick in all 3 divisions bilaterally. Corneal  responses are intact.  CN VII: Face is symmetric with normal eye closure and smile. CN VIII: Hearing is normal to casual conversation CN IX, X: Palate elevates symmetrically. Phonation is normal. CN XI: Head turning and shoulder shrug are intact CN XII: Tongue is midline with normal movements and no atrophy.  MOTOR: There is no pronator drift of out-stretched arms. Muscle bulk and tone are normal. Muscle strength is normal.  REFLEXES: Reflexes are 2+ and symmetric at the biceps, triceps, knees, and ankles. Plantar responses are flexor.  SENSORY: Intact to light touch, pinprick, positional and vibratory sensation are intact in fingers and toes.  COORDINATION: Rapid alternating movements and fine finger movements are intact. There is no dysmetria on finger-to-nose and heel-knee-shin.    GAIT/STANCE: She  can get up from sitting position arm crossed, mildly antalgic due to pain,   REVIEW OF SYSTEMS: Out of a complete 14 system review of symptoms, the patient complains only of the following symptoms, and all other reviewed systems are negative.  Fatigue  ALLERGIES: Allergies  Allergen Reactions   Nitrofurantoin Itching    Redness in feet    Wasp Venom Protein Other (See Comments)    cellutlitis    HOME MEDICATIONS: Outpatient Medications Prior to Visit  Medication Sig Dispense Refill   amLODipine (NORVASC) 2.5 MG tablet Take 2.5 mg by mouth daily.     amphetamine-dextroamphetamine (ADDERALL) 20 MG tablet Take 10-20 mg by mouth daily.     atorvastatin (LIPITOR) 20 MG tablet Take 20 mg by mouth daily.     CALCIUM-MAGNESIUM-ZINC PO Take 1 tablet by mouth daily.     DULoxetine (CYMBALTA) 60 MG capsule Take 1 capsule (60 mg total) by mouth daily. (Patient taking differently: Take 120 mg by mouth daily.) 90 capsule 4   estradiol (ESTRACE) 0.1 MG/GM vaginal cream Place 1 Applicatorful vaginally once a week.     gabapentin (NEURONTIN) 300 MG capsule Take 1 capsule (300 mg total) by mouth 3 (three) times daily. (Patient taking differently: Take 600 mg by mouth at bedtime.) 270 capsule 3   levothyroxine (SYNTHROID) 112 MCG tablet Take 112 mcg by mouth daily before breakfast.     liothyronine (CYTOMEL) 5 MCG tablet Take 2 tablets before breakfast and 1 tablet before dinner (Patient taking differently: Take 7 mcg by mouth daily.) 270 tablet 0   LORazepam (ATIVAN) 2 MG tablet Take 1-2 mg by mouth 3 (three) times daily as needed for anxiety.     oxyCODONE (OXY IR/ROXICODONE) 5 MG immediate release tablet Take 1-2 tablets (5-10 mg total) by mouth every 3 (three) hours as needed for severe pain or moderate pain ((score 4 to 6)). 30 tablet 0   oxyCODONE (OXYCONTIN) 10 mg 12 hr tablet Take 1 tablet (10 mg total) by mouth every 12 (twelve) hours. 14 tablet 0   Polyethylene Glycol 400 (BLINK TEARS OP) Place  1 drop into both eyes daily as needed (dry eyes).     traZODone (DESYREL) 100 MG tablet Take 100 mg by mouth at bedtime.     No facility-administered medications prior to visit.    PAST MEDICAL HISTORY: Past Medical History:  Diagnosis Date   ADHD    Anxiety 2012   Chronic neck pain    Depression    Fibromyalgia    Hepatitis 2008   Hyperlipemia    Hypertension 2022   Hypothyroidism 1989   Neuropathy    chronic demyelinating polyradiculoneuropathy, s/p IVIG   Pneumonia 2012   Raynaud's disease  Thyroid disease     PAST SURGICAL HISTORY: Past Surgical History:  Procedure Laterality Date   arm surgery Right    Fracture   CESAREAN SECTION     LEG SURGERY Right    Fracture   TRANSFORAMINAL LUMBAR INTERBODY FUSION (TLIF) WITH PEDICLE SCREW FIXATION 1 LEVEL N/A 01/17/2021   Procedure: Transforaminal Lumbar Interbody Fusion Lumbar Four- Five;  Surgeon: Vallarie Mare, MD;  Location: [facility];  Service: Neurosurgery;  Laterality: N/A;   TUBAL LIGATION      FAMILY HISTORY: Family History  Problem Relation Age of Onset   Heart attack Mother    Thyroid disease Mother    Stroke Father    Thyroid disease Sister     SOCIAL HISTORY: Social History   Socioeconomic History   Marital status: Single    Spouse name: Not on file   Number of children: 2   Years of education: 14   Highest education level: Associate degree: occupational, Hotel manager, or vocational program  Occupational History   Occupation: [employer]  Tobacco Use   Smoking status: Every Day    Packs/day: 1.00    Types: Cigarettes   Smokeless tobacco: Never  Vaping Use   Vaping Use: Never used  Substance and Sexual Activity   Alcohol use: Yes    Alcohol/week: 1.0 standard drink    Types: 1 Shots of liquor per week    Comment: 1 margarita a night   Drug use: Yes    Types: Marijuana    Comment: smokes daily   Sexual activity: Not on file  Other Topics Concern   Not on file  Social History  Narrative   Lives at home with her boyfriend.   Right-handed.   1 cup caffeine per day.   Social Determinants of Health   Financial Resource Strain: Not on file  Food Insecurity: Not on file  Transportation Needs: Not on file  Physical Activity: Not on file  Stress: Not on file  Social Connections: Not on file  Intimate Partner Violence: Not on file  -------

## 2021-05-14 NOTE — Telephone Encounter (Signed)
RN faxed orders RN spoke to pt upfront

## 2021-05-14 NOTE — Telephone Encounter (Signed)
Patient came back to the lobby stating her medication was not called in to the pharmacy and she was worried about it. Ivig - [facility] fax [phone]

## 2021-05-14 NOTE — Telephone Encounter (Signed)
Charlie from [facility] is asking for a call back from a RN re: questions he has about pt's Prevagen.  Please call him back at [phone]

## 2021-05-15 NOTE — Telephone Encounter (Signed)
I call infusion pharmacy back and spoke with Huston Foley.  He was asking for VO to add benadryl 25-50 mg pre and post IVIG infusion. I have provided order as requested. A formal sign off order should follow reflecting this.

## 2021-05-15 NOTE — Telephone Encounter (Signed)
Will @ [facility] is asking for a call back from [place].  He can be called back [phone] option 3

## 2021-05-17 ENCOUNTER — Encounter: Payer: Self-pay | Admitting: Neurology

## 2021-05-17 NOTE — Progress Notes (Signed)
ASSESSMENT AND PLAN 61 y.o. year old female  has a past medical history of ADHD, Chronic neck pain, Depression, Fibromyalgia, Hyperlipemia, Neuropathy, Raynaud's disease, and Thyroid disease. here with:  Chronic demyelinating polyradiculoneuropathy. Overall has much improved  -Repeat EMG nerve conduction study on July 13, 2019, continue showed features of demyelinating neuropathy, evident by prolonged distal latency, slow conduction velocity.  Prolonged F-wave latency,  -Spinal fluid testing in 2019 showed total protein of 110, cytoalbumin dissociation, WBC of 0.   -She received IVIG treatment since June 2019, continue to receive home maintenance treatment   Will repeat EMG nerve conduction study, if she is doing very well, may consider stop home IVIG,  Lumbar stenosis Status post decompression in September 2022, Significant improvement since then On Cymbalta and gabapentin   DIAGNOSTIC DATA (LABS, IMAGING, TESTING) - I reviewed patient records, labs, notes, testing and imaging myself where available.  EMG/NCS in May 2021 This is an abnormal study.  There is evidence of chronic demyelinating polyradiculoneuropathy.  There is no evidence of axonal loss.  Compared to previous study in April 2019, there was mild to moderate improvement, as evident by increased multiple motor CMAP amplitude, improved F-wave latency, and appearance of bilateral lower extremity sensory responses.    MRI of the lumbar spine without contrast shows the following on May 8th 2021. 1.   At L4-L5, there is severe spinal stenosis due to anterolisthesis, disc protrusion, facet hypertrophy and ligamentum flavum hypertrophy.  There is moderately severe right lateral recess stenosis with probable right L5 nerve root compression.  Additionally, due to the severity of the spinal stenosis, other traversing nerve roots could be compressed. 2.   At L3-L4, there is mild to moderate spinal stenosis.  There is potential  for left L4 nerve root compression. 3.   There are moderate degenerative changes at the other lumbar levels without spinal stenosis or nerve root compression..   Normal MRI thoracic spine (without) on October 29 2017   HISTORY OF PRESENT ILLNESS:  Haley Valdez is a 61 year old female, seen in refer by her primary care doctor [name].  for evaluation of transient ischemic attack, initial evaluation was on April 22, 2017.   I reviewed and summarized the referring note, she has past medical history of hypothyroidism, peripheral neuropathy, hyperlipidemia,   She works as a Copywriter, advertising for many years, around 1995, she began to experience neck pain, intermittent bilateral feet and hand paresthesia, began to seek neurological care, per patient, patient was diagnosed was carpal tunnel syndromes, peripheral neuropathy,   Then she began to develop unsteady gait,  began to fall since age 6,   She was also diagnosed with hepatitis C received treatment around 2008, around that time, she complains of excessive fatigue, generalized weakness,   In 2015, with her abnormal neck posturing, persistent neck pain, she received Botox injection in May 2015, responded very well, second injection in August 2015, has caused left neck pain, swollen, weakness, has to hold her head up with her hand   Since August 2015, she complains of frequent left-sided neck pain, radiating pain to left occipital, left parietal region, left side headaches,   She had extensive evaluations, I was able to review outside MRI cervical report October 2015, Left C3-4 facet abnormality is deep to the tender palpable  abnormality. The facet is overgrown due to degenerative change. In addition, there is bone marrow and adjacent soft tissue edema and enhancement suggesting active osteoarthritis. Underlying infection not  excluded but considered less likely. Correlate with symptoms. Nevidence of discitis or abscess. There is  cervical spondylosis. No other acute abnormality.  No mass or adenopathy detected.   MRI brian in Oct 2018: Mild nonspecific supratentorial small vessel disease, no contrast enhancement,   Repeat MRI in Cervcial in June 2018.  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, with severe left foraminal stenosis, multilevel degenerative disc disease, she is now referred to neurosurgeon, pain management for epidural injection   She continued complaints of intermittent bilateral hand and feet paresthesia, gait abnormality, but today's neurological examination are fairly normal,   Laboratory evaluations in October 2018, INR 0.9 normal CBC,   Update July 22, 2017: She is alone at today's clinical visit, pressed speech, volunteer a lot of informations, long history of ADHD, taking Adderall 10 mg daily, also on polypharmacy treatment, chronic methadone treatment 10 mg 3 times a day, also taking Ativan 1 mg every 8 hours, Cymbalta 60 mg daily, Neurontin 300 mg 3 times a day   She continue complains of neck pain, radiating pain to left shoulder, drop things from her left hand,    I was able to review the EMG nerve conduction study dated April 09, 2017 by Dr. [name], there was no evidence of left C5-6 radiculopathy, moderate carpal tunnel at the left wrist, moderate severe left ulnar neuropathy at the wrist, this is based on active neuropathic changes noted in the left deltoid, biceps, triceps, pronator teres, abductor pollicis brevis, and left cervical paraspinals,   The study showed moderately prolonged left median sensory peak latency, with severely prolonged left median motor distal latency, normal C map amplitude, mild slow conduction velocity.   She previously received the Botox injection for her neck pain, abnormal neck posturing in May again August 2015 by outside neurologist [name], [name], I was able to review injection note, 1. Bilateral Upper trapezius received 40 units each side 2. Splenius  Capiti received 20 units each side 3. Longissimus 20 units each side 4. Semispinalis Capitis 20 units side   She reported since her injection in August 2015, she had a knot at left neck from injection, been persistent, continue have significant neck pain, frequent headaches,   We have personally reviewed MRI of the brain with and without contrast in October 2018: No acute abnormality, scattered subcortical hyperintense T2 signal changes, largest is at left anterior temporal lobe, no contrast enhancement, most consistent with small vessel disease.   MRI of cervical spine multilevel degenerative changes, left facet arthritis at C3-4 with severe left foraminal stenosis, there is no significant canal stenosis.   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  There is also suggestion of temporal dispersion at bilateral tibial proximal stimulation sites.   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 on October 08, 2017, decreased TSH, there  was suggestion of her thyroid supplement change, the patient did not make any changes yet,   She continue has mood swings, pressed speech, complains of bilateral feet paresthesia, gait abnormality, urinary urgency, she did have significant hyperreflexia of  bilateral patella but absent ankle reflexes, less dependent sensory changes, MRI of the brain and cervical findings would not explain her hyperreflexia, ordered MRI of thoracic spine   UPDATE November 26 2017: She had her first home IVIG infusion through [facility] on June 13, 14, a week after infusion, she reported doing well, however, about 3 weeks after the infusion, in early July 2019 she noticed a rash broke out at the bottom of her feet, arm, dry scaly skins, there is also raised erythematous dry scaly rash at bilateral leg, she reported similar reaction to interferon for hepatitis C treatment in the past, she was seen by her dermatologist Dr.Walter Elvera Lennox in recent few months, was given the diagnosis of eczema, given a prescription of dexamethasone cream.   Patient stated, [facility] agent is planning on switching from Octgam to different 10% IVIG privigen, she complains of excessive stress, depression, " she feel manner just want to stay in bed all the time"    IVIG infusion in June did help her some, she feel more energetic,  Less lower extremity spasticity, paresthesia,   MRI of thoracic spine on October 29, 2017 was normal   UPDATE Apr 22 2018: She continued her IVIG infusion, did notice less fatigue, lessening paresthesia and pain, she is going through a lot of stress, complains of worsening anxiety, she wished to continue IVIG treatment, but has to switch to a different provider in [facility] system for better coverage from her insurance company,   Update June 01, 2019: She has lost follow-up since last visit in December 2019 due to health insurance, she is on thyroid supplement now, she had a history of chronic diffuse body achy pain, fibromyalgia, was under pain management, also see psychiatrist, taking Cymbalta 60 mg daily, gabapentin 300 mg 3 times a day, methadone 10 mg every 8 hours, Adderall 10 mg daily   She has been receiving her IVIG through home health on a monthly  basis, she continues to report improvement, continue has mild gait abnormality, especially when closing her eyes   She was diagnosed with hand and feet psoriasis,   Laboratory evaluation from [facility] in September 2020, normal hemoglobin of 12.8, elevated triglyceride 509, total cholesterol 205, normal CMP, creatinine of 0.96, UDS was positive for methadone, amphetamine,   UPDATE July 13 2019: Patient has been receiving IVIG treatment on a monthly basis, she is doing very well, continue have mild improvement, but continue complains of bilateral upper and lower extremity paresthesia, sometimes achy pain   She had a history of chronic low back pain, worsening since September 2020, now complains of frequent radiating pain to bilateral lower extremity, difficult to sit still, or standing for too long, also has urinary urgency   She return for electrodiagnostic study today, which continues show evidence of demyelinating polyradiculoneuropathy, but mild improvement compared to previous EMG study in April 2019     UPDATE October 13 2019: She continue to improve with IVIG treatment, she tolerated treatment well, with each IVIG infusion, she felt the last bilateral upper and lower extremity paresthesia and neuropathic pain, not improved gait abnormality, she does complains of worsening low back pain, electrodiagnostic study in March 2021 showed evidence of chronic bilateral lumbosacral radiculopathy changes She also complains  of urinary urgency, frequency, occasionally stress incontinence We personally reviewed MRI of lumbar spine in May 2021, evidence of severe spinal stenosis at L4-5 due to anterolisthesis, disc protrusion, facet hypertrophy and ligamentum flavum hypertrophy.  There is moderately severe right lateral recess stenosis with probable right L5 nerve root compression.  Additionally, due to the severity of the spinal stenosis, other traversing nerve roots could be compressed.; L3-L4, there is  mild to moderate spinal stenosis.  There is potential for left L4 nerve root compression. There are moderate degenerative changes at the other lumbar levels without spinal stenosis or nerve root compression.  UPDATE June 15th 2022: Her bilateral feet lower extremity burning pain, discoloration has much improved with continued IVIG treatment, 1 g/kg every 3 to 4 weeks through home health,  The most bothersome symptom for her is worsening lower extremity pain, radiating pain to bilateral lower extremity, recent few months, she complains 24/7 10 out of 10 severe pain,  She is also going through a lot of stress, just separated from her boyfriend of 10 years, tearful during today's interview, taking Cymbalta 60 mg daily which is helpful, previously gabapentin was helpful, she worried about her difficulty concentrating, contributed to gabapentin, stopped gabapentin without advise, noticed worsening pain,  In addition, she complains of worsening urinary frequency, incontinence, was seen by neurosurgeon in the past, deemed to be a surgical candidate," I can no longer tolerate the pain, needs to have surgery now"  Update May 13, 2021: She is overall doing very well, tolerating home IVIG treatment, she does complains of fatigue after each infusion, she walked much better now, continue has bilateral toes paresthesia, but denies significant pain, denies significant finger paresthesia pain  She had lumbar decompression surgery in September 2022, recovered well,   PHYSICAL EXAM  Vitals:   10/17/20 1116  BP: (!) 145/87  Pulse: 77  Weight: 153 lb 8 oz (69.6 kg)  Height: '5\' 7"'  (1.702 m)   Body mass index is 24.04 kg/m.  Generalized: Well developed, in no acute distress   PHYSICAL EXAMNIATION:  Gen: NAD, conversant, well nourised, well groomed             NEUROLOGICAL EXAM:  MENTAL STATUS: Speech/cognition: Awake, alert, oriented to history taking and casual conversation   CRANIAL  NERVES: CN II: Visual fields are full to confrontation.  Pupils are round equal and briskly reactive to light. CN III, IV, VI: extraocular movement are normal. No ptosis. CN V: Facial sensation is intact to pinprick in all 3 divisions bilaterally. Corneal responses are intact.  CN VII: Face is symmetric with normal eye closure and smile. CN VIII: Hearing is normal to casual conversation CN IX, X: Palate elevates symmetrically. Phonation is normal. CN XI: Head turning and shoulder shrug are intact CN XII: Tongue is midline with normal movements and no atrophy.  MOTOR: There is no pronator drift of out-stretched arms. Muscle bulk and tone are normal. Muscle strength is normal.  REFLEXES: Reflexes are 2+ and symmetric at the biceps, triceps, knees, and ankles. Plantar responses are flexor.  SENSORY: Intact to light touch, pinprick, positional and vibratory sensation are intact in fingers and toes.  COORDINATION: Rapid alternating movements and fine finger movements are intact. There is no dysmetria on finger-to-nose and heel-knee-shin.    GAIT/STANCE: She can get up from sitting position arm crossed steady, negative Romberg sign   REVIEW OF SYSTEMS: Out of a complete 14 system review of symptoms, the patient complains only of the following symptoms,  and all other reviewed systems are negative.  Fatigue  ALLERGIES: Allergies  Allergen Reactions   Nitrofurantoin Itching    Redness in feet    Wasp Venom Protein Other (See Comments)    cellutlitis    HOME MEDICATIONS: Outpatient Medications Prior to Visit  Medication Sig Dispense Refill   amLODipine (NORVASC) 2.5 MG tablet Take 2.5 mg by mouth daily.     amphetamine-dextroamphetamine (ADDERALL) 20 MG tablet Take 10-20 mg by mouth daily.     atorvastatin (LIPITOR) 20 MG tablet Take 20 mg by mouth daily.     CALCIUM-MAGNESIUM-ZINC PO Take 1 tablet by mouth daily.     DULoxetine (CYMBALTA) 60 MG capsule Take 1 capsule (60 mg  total) by mouth daily. (Patient taking differently: Take 120 mg by mouth daily.) 90 capsule 4   estradiol (ESTRACE) 0.1 MG/GM vaginal cream Place 1 Applicatorful vaginally once a week.     gabapentin (NEURONTIN) 300 MG capsule Take 1 capsule (300 mg total) by mouth 3 (three) times daily. (Patient taking differently: Take 600 mg by mouth at bedtime.) 270 capsule 3   levothyroxine (SYNTHROID) 112 MCG tablet Take 112 mcg by mouth daily before breakfast.     liothyronine (CYTOMEL) 5 MCG tablet Take 2 tablets before breakfast and 1 tablet before dinner (Patient taking differently: Take 7 mcg by mouth daily.) 270 tablet 0   LORazepam (ATIVAN) 2 MG tablet Take 1-2 mg by mouth 3 (three) times daily as needed for anxiety.     oxyCODONE (OXY IR/ROXICODONE) 5 MG immediate release tablet Take 1-2 tablets (5-10 mg total) by mouth every 3 (three) hours as needed for severe pain or moderate pain ((score 4 to 6)). 30 tablet 0   oxyCODONE (OXYCONTIN) 10 mg 12 hr tablet Take 1 tablet (10 mg total) by mouth every 12 (twelve) hours. 14 tablet 0   Polyethylene Glycol 400 (BLINK TEARS OP) Place 1 drop into both eyes daily as needed (dry eyes).     traZODone (DESYREL) 100 MG tablet Take 100 mg by mouth at bedtime.     No facility-administered medications prior to visit.    PAST MEDICAL HISTORY: Past Medical History:  Diagnosis Date   ADHD    Anxiety 2012   Chronic neck pain    Depression    Fibromyalgia    Hepatitis 2008   Hyperlipemia    Hypertension 2022   Hypothyroidism 1989   Neuropathy    chronic demyelinating polyradiculoneuropathy, s/p IVIG   Pneumonia 2012   Raynaud's disease    Thyroid disease     PAST SURGICAL HISTORY: Past Surgical History:  Procedure Laterality Date   arm surgery Right    Fracture   CESAREAN SECTION     LEG SURGERY Right    Fracture   TRANSFORAMINAL LUMBAR INTERBODY FUSION (TLIF) WITH PEDICLE SCREW FIXATION 1 LEVEL N/A 01/17/2021   Procedure: Transforaminal Lumbar  Interbody Fusion Lumbar Four- Five;  Surgeon: Vallarie Mare, MD;  Location: [facility];  Service: Neurosurgery;  Laterality: N/A;   TUBAL LIGATION      FAMILY HISTORY: Family History  Problem Relation Age of Onset   Heart attack Mother    Thyroid disease Mother    Stroke Father    Thyroid disease Sister     SOCIAL HISTORY: Social History   Socioeconomic History   Marital status: Single    Spouse name: Not on file   Number of children: 2   Years of education: 14   Highest education level: Futures trader  degree: occupational, Hotel manager, or vocational program  Occupational History   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   Right-handed.   1 cup caffeine per day.   Social Determinants of Health   Financial Resource Strain: Not on file  Food Insecurity: Not on file  Transportation Needs: Not on file  Physical Activity: Not on file  Stress: Not on file  Social Connections: Not on file  Intimate Partner Violence: Not on file

## 2021-05-22 ENCOUNTER — Ambulatory Visit: Payer: Medicare HMO | Admitting: Neurology

## 2021-07-17 ENCOUNTER — Encounter: Payer: Medicare HMO | Admitting: Neurology

## 2021-07-24 ENCOUNTER — Telehealth: Payer: Self-pay | Admitting: Neurology

## 2021-07-24 NOTE — Telephone Encounter (Signed)
[place], NCS/EMG has been cancelled, please refer out  ?

## 2021-07-24 NOTE — Telephone Encounter (Signed)
No need to refer out. It is OK for her to cancel now, will make decision about NCS at next follow up visit ?

## 2021-07-29 ENCOUNTER — Encounter: Payer: Medicare HMO | Admitting: Neurology

## 2021-07-29 NOTE — Progress Notes (Unsigned)
Received notice from [facility] that patient received IVIG Privigen 70g on 07/08/2021 and tolerated it well. ?

## 2021-09-10 ENCOUNTER — Telehealth: Payer: Self-pay | Admitting: Neurology

## 2021-09-10 ENCOUNTER — Encounter: Payer: Self-pay | Admitting: Neurology

## 2021-09-10 ENCOUNTER — Ambulatory Visit: Payer: Medicare HMO | Admitting: Neurology

## 2021-09-10 VITALS — BP 128/79 | HR 80 | Ht 67.0 in | Wt 163.5 lb

## 2021-09-10 DIAGNOSIS — R4189 Other symptoms and signs involving cognitive functions and awareness: Secondary | ICD-10-CM | POA: Insufficient documentation

## 2021-09-10 DIAGNOSIS — G6181 Chronic inflammatory demyelinating polyneuritis: Secondary | ICD-10-CM

## 2021-09-10 NOTE — Telephone Encounter (Signed)
Updated orders received, signed by Dr. Krista Blue and faxed back to [facility] at [phone]. ?

## 2021-09-10 NOTE — Telephone Encounter (Signed)
We will change his IVIG orders to 70 g every 3 weeks, ? ?She complains of lower extremity paresthesia, increased muscle cramps at the end of weeks. ?

## 2021-09-10 NOTE — Telephone Encounter (Signed)
[employer] Josem Kaufmann: [id number] exp. 09/10/21-10/10/21 sent to [facility] they will call patient to schedule ?

## 2021-09-10 NOTE — Telephone Encounter (Addendum)
The patient was previously getting IVIG (Privigen), 70 grams every 4 weeks. Due to breakthrough symptoms, Dr. Terrace Arabia is changing the frequency to every 3 weeks (still 70 grams over one day). ? ?I called [facility] at [phone] and spoke to the pharmacist, Tresa Endo. She was informed of the patient's new orders.  ? ?She will update the order and fax over for Dr. Terrace Arabia to sign.  ?

## 2021-09-10 NOTE — Progress Notes (Signed)
? ? ?ASSESSMENT AND PLAN ?61 y.o. year old female    ? ?Chronic demyelinating polyradiculoneuropathy. ? Overall doing very well ? Continue home IVIG treatment, 1 g/kg was every 4 weeks, complains of increased lower extremity paresthesia muscle cramping at the end of 4-week, will change her every 3 weeks ? ?Lumbar stenosis ?MRI lumbar spine May 2021 showed severe stenosis at L4-5 ?Status post decompression in September 2022, recovering well ? ?Neuropathic pain ? Much improved ?Continue Cymbalta 60 mg daily,  ?Gabapentin 300 mg 2 tablets every night ? ?Mild cognitive impairment ? MoCA examination 28/30 ? MRI of the brain to rule out structural abnormality ? Laboratory evaluation to rule out treatable etiology ? ?DIAGNOSTIC DATA (LABS, IMAGING, TESTING) ?- I reviewed patient records, labs, notes, testing and imaging myself where available. ? ?EMG/NCS in May 2021 ?This is an abnormal study.  There is evidence of chronic demyelinating polyradiculoneuropathy.  There is no evidence of axonal loss.  Compared to previous study in April 2019, there was mild to moderate improvement, as evident by increased multiple motor CMAP amplitude, improved F-wave latency, and appearance of bilateral lower extremity sensory responses. ?  ? ?MRI of the lumbar spine without contrast shows the following on May 8th 2021. ?1.   At L4-L5, there is severe spinal stenosis due to anterolisthesis, disc protrusion, facet hypertrophy and ligamentum flavum hypertrophy.  There is moderately severe right lateral recess stenosis with probable right L5 nerve root compression.  Additionally, due to the severity of the spinal stenosis, other traversing nerve roots could be compressed. ?2.   At L3-L4, there is mild to moderate spinal stenosis.  There is potential for left L4 nerve root compression. ?3.   There are moderate degenerative changes at the other lumbar levels without spinal stenosis or nerve root compression.. ? ? ?Normal MRI thoracic spine  (without) on October 29 2017 ?  ?HISTORY OF PRESENT ILLNESS: ? ?Jamyia Fortune is a 61 year old female, seen in refer by her primary care doctor [name].  for evaluation of transient ischemic attack, initial evaluation was on April 22, 2017. ?  ?I reviewed and summarized the referring note, she has past medical history of hypothyroidism, peripheral neuropathy, hyperlipidemia, ?  ?She works as a Copywriter, advertising for many years, around 1995, she began to experience neck pain, intermittent bilateral feet and hand paresthesia, began to seek neurological care, per patient, patient was diagnosed was carpal tunnel syndromes, peripheral neuropathy, ?  ?Then she began to develop unsteady gait,  began to fall since age 38, ?  ?She was also diagnosed with hepatitis C received treatment around 2008, around that time, she complains of excessive fatigue, generalized weakness, ?  ?In 2015, with her abnormal neck posturing, persistent neck pain, she received Botox injection in May 2015, responded very well, second injection in August 2015, has caused left neck pain, swollen, weakness, has to hold her head up with her hand ?  ?Since August 2015, she complains of frequent left-sided neck pain, radiating pain to left occipital, left parietal region, left side headaches, ?  ?She had extensive evaluations, I was able to review outside MRI cervical report October 2015, ?Left C3-4 facet abnormality is deep to the tender palpable  abnormality. The facet is overgrown due to degenerative change. In addition, there is bone marrow and adjacent soft tissue edema and ?enhancement suggesting active osteoarthritis. Underlying infection not excluded but considered less likely. Correlate with symptoms. Nevidence of discitis or abscess. There is cervical spondylosis. ?No  other acute abnormality.  No mass or adenopathy detected. ?  ?MRI [facility] in Oct 2018: Mild nonspecific supratentorial small vessel disease, no contrast enhancement, ?   ?Repeat MRI in [place] in June 2018.  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, with severe left foraminal stenosis, multilevel degenerative disc disease, she is now referred to neurosurgeon, pain management for epidural injection ?  ?She continued complaints of intermittent bilateral hand and feet paresthesia, gait abnormality, but today's neurological examination are fairly normal, ?  ?Laboratory evaluations in October 2018, INR 0.9 normal CBC, ?  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  ?She continue complains of neck pain, radiating pain to left shoulder, drop things from her left hand, ?  ? I was able to review the EMG nerve conduction study dated April 09, 2017 by Dr. [name], there was no evidence of left C5-6 radiculopathy, moderate carpal tunnel at the left wrist, moderate severe left ulnar neuropathy at the wrist, this is based on active neuropathic changes noted in the left deltoid, biceps, triceps, pronator teres, abductor pollicis brevis, and left cervical paraspinals, ?  ?The study showed moderately prolonged left median sensory peak latency, with severely prolonged left median motor distal latency, normal C map amplitude, mild slow conduction velocity. ?  ?She previously received the Botox injection for her neck pain, abnormal neck posturing in May again August 2015 by outside neurologist [name], [name], I was able to review injection note, ?1. Bilateral Upper trapezius received 40 units each side ?2. Splenius Capiti received 20 units each side ?3. Longissimus 20 units each side ?4. Semispinalis Capitis 20 units side ?  ?She reported since her injection in August 2015, she had a knot at left neck  from injection, been persistent, continue have significant neck pain, frequent headaches, ?  ?We have personally reviewed MRI of the brain with and without contrast in October 2018: No acute abnormality, scattered subcortical hyperintense T2 signal changes, largest is at left anterior temporal lobe, no contrast enhancement, most consistent with small vessel disease. ?  ?MRI of cervical spine multilevel degenerative changes, left facet arthritis at C3-4 with severe left foraminal stenosis, there is no significant canal stenosis. ?  ?Laboratory evaluation showed normal or negative protein electrophoresis, Lyme titer, A1c, B12, mildly low vitamin D 28, ESR, RPR, HIV, C-reactive protein, hepatitis, ANA, TSH, copper ?  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  There is also suggestion of temporal dispersion at bilateral tibial proximal stimulation sites. ?  ?Spinal fluid testing showed  total protein of 110, with 0 WBC, normal total protein, ?  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  ?She has her first round of IVIG at home on June 13 and 14th, did not notice any significant improvement, ?  ?I was able to review the records from his endocrinologist [name] on October 08, 2017, decreased TSH, there was suggestion of her thyroid supplement change, the patient did not make any changes yet, ?  ?She  continue has mood swings, pressed speech, complains of bilateral feet paresthesia, gait abnormality, urinary urgency, she did have significant hyperreflexia of bilateral patella but absent ankle reflexes, less dependent sensory changes, MRI of the brain and cervical findings would not explain her hyperreflexia, ordered MRI of thoracic spine ?  ?UPDATE November 26 2017: ?She had her first home IVIG infusion through [facility] on June 13, 14, a week after infusion, she reported doing well, however, about 3 weeks after the infusion, in early July 2019 she noticed a rash

## 2021-09-11 LAB — TSH: TSH: 2.92 u[IU]/mL (ref 0.450–4.500)

## 2021-09-11 LAB — VITAMIN B12: Vitamin B-12: 567 pg/mL (ref 232–1245)

## 2021-09-11 LAB — HIV ANTIBODY (ROUTINE TESTING W REFLEX): HIV Screen 4th Generation wRfx: NONREACTIVE

## 2021-09-11 LAB — RPR: RPR Ser Ql: NONREACTIVE

## 2021-09-23 ENCOUNTER — Other Ambulatory Visit: Payer: Medicare HMO

## 2021-10-03 DEATH — deceased

## 2021-10-06 ENCOUNTER — Other Ambulatory Visit: Payer: Self-pay | Admitting: Neurology

## 2021-10-07 NOTE — Telephone Encounter (Signed)
Rx refilled.

## 2022-03-18 NOTE — Progress Notes (Deleted)
ASSESSMENT AND PLAN 61 y.o. year old female     Chronic demyelinating polyradiculoneuropathy.  Overall doing very well  Continue home IVIG treatment, 1 g/kg was every 4 weeks, complains of increased lower extremity paresthesia muscle cramping at the end of 4-week, will change her every 3 weeks  Lumbar stenosis MRI lumbar spine May 2021 showed severe stenosis at L4-5 Status post decompression in September 2022, recovering well  Neuropathic pain  Much improved Continue Cymbalta 60 mg daily,  Gabapentin 300 mg 2 tablets every night  Mild cognitive impairment  MoCA examination 28/30  MRI of the brain to rule out structural abnormality  Laboratory evaluation to rule out treatable etiology  DIAGNOSTIC DATA (LABS, IMAGING, TESTING) - I reviewed patient records, labs, notes, testing and imaging myself where available.  EMG/NCS in May 2021 This is an abnormal study.  There is evidence of chronic demyelinating polyradiculoneuropathy.  There is no evidence of axonal loss.  Compared to previous study in April 2019, there was mild to moderate improvement, as evident by increased multiple motor CMAP amplitude, improved F-wave latency, and appearance of bilateral lower extremity sensory responses.    MRI of the lumbar spine without contrast shows the following on May 8th 2021. 1.   At L4-L5, there is severe spinal stenosis due to anterolisthesis, disc protrusion, facet hypertrophy and ligamentum flavum hypertrophy.  There is moderately severe right lateral recess stenosis with probable right L5 nerve root compression.  Additionally, due to the severity of the spinal stenosis, other traversing nerve roots could be compressed. 2.   At L3-L4, there is mild to moderate spinal stenosis.  There is potential for left L4 nerve root compression. 3.   There are moderate degenerative changes at the other lumbar levels without spinal stenosis or nerve root compression..   Normal MRI thoracic spine  (without) on October 29 2017   HISTORY OF PRESENT ILLNESS:  Haley Valdez is a 61 year old female, seen in refer by her primary care doctor [name].  for evaluation of transient ischemic attack, initial evaluation was on April 22, 2017.   I reviewed and summarized the referring note, she has past medical history of hypothyroidism, peripheral neuropathy, hyperlipidemia,   She works as a Copywriter, advertising for many years, around 1995, she began to experience neck pain, intermittent bilateral feet and hand paresthesia, began to seek neurological care, per patient, patient was diagnosed was carpal tunnel syndromes, peripheral neuropathy,   Then she began to develop unsteady gait,  began to fall since age 28,   She was also diagnosed with hepatitis C received treatment around 2008, around that time, she complains of excessive fatigue, generalized weakness,   In 2015, with her abnormal neck posturing, persistent neck pain, she received Botox injection in May 2015, responded very well, second injection in August 2015, has caused left neck pain, swollen, weakness, has to hold her head up with her hand   Since August 2015, she complains of frequent left-sided neck pain, radiating pain to left occipital, left parietal region, left side headaches,   She had extensive evaluations, I was able to review outside MRI cervical report October 2015, Left C3-4 facet abnormality is deep to the tender palpable  abnormality. The facet is overgrown due to degenerative change. In addition, there is bone marrow and adjacent soft tissue edema and enhancement suggesting active osteoarthritis. Underlying infection not excluded but considered less likely. Correlate with symptoms. Nevidence of discitis or abscess. There is cervical spondylosis. No other  acute abnormality.  No mass or adenopathy detected.   MRI brian in Oct 2018: Mild nonspecific supratentorial small vessel disease, no contrast enhancement,    Repeat MRI in Cervcial in June 2018.  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, with severe left foraminal stenosis, multilevel degenerative disc disease, she is now referred to neurosurgeon, pain management for epidural injection   She continued complaints of intermittent bilateral hand and feet paresthesia, gait abnormality, but today's neurological examination are fairly normal,   Laboratory evaluations in October 2018, INR 0.9 normal CBC,   Update July 22, 2017: She is alone at today's clinical visit, pressed speech, volunteer a lot of informations, long history of ADHD, taking Adderall 10 mg daily, also on polypharmacy treatment, chronic methadone treatment 10 mg 3 times a day, also taking Ativan 1 mg every 8 hours, Cymbalta 60 mg daily, Neurontin 300 mg 3 times a day   She continue complains of neck pain, radiating pain to left shoulder, drop things from her left hand,    I was able to review the EMG nerve conduction study dated April 09, 2017 by Dr. [name], there was no evidence of left C5-6 radiculopathy, moderate carpal tunnel at the left wrist, moderate severe left ulnar neuropathy at the wrist, this is based on active neuropathic changes noted in the left deltoid, biceps, triceps, pronator teres, abductor pollicis brevis, and left cervical paraspinals,   The study showed moderately prolonged left median sensory peak latency, with severely prolonged left median motor distal latency, normal C map amplitude, mild slow conduction velocity.   She previously received the Botox injection for her neck pain, abnormal neck posturing in May again August 2015 by outside neurologist [name], [name], I was able to review injection note, 1. Bilateral Upper trapezius received 40 units each side 2. Splenius Capiti received 20 units each side 3. Longissimus 20 units each side 4. Semispinalis Capitis 20 units side   She reported since her injection in August 2015, she had a knot at left neck  from injection, been persistent, continue have significant neck pain, frequent headaches,   We have personally reviewed MRI of the brain with and without contrast in October 2018: No acute abnormality, scattered subcortical hyperintense T2 signal changes, largest is at left anterior temporal lobe, no contrast enhancement, most consistent with small vessel disease.   MRI of cervical spine multilevel degenerative changes, left facet arthritis at C3-4 with severe left foraminal stenosis, there is no significant canal stenosis.   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  There is also suggestion of temporal dispersion at bilateral tibial proximal stimulation sites.   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 on October 08, 2017, decreased TSH, there was suggestion of her thyroid supplement change, the patient did not make any changes yet,   She continue  has mood swings, pressed speech, complains of bilateral feet paresthesia, gait abnormality, urinary urgency, she did have significant hyperreflexia of bilateral patella but absent ankle reflexes, less dependent sensory changes, MRI of the brain and cervical findings would not explain her hyperreflexia, ordered MRI of thoracic spine   UPDATE November 26 2017: She had her first home IVIG infusion through [facility] on June 13, 14, a week after infusion, she reported doing well, however, about 3 weeks after the infusion, in early July 2019 she noticed a rash broke out at the bottom of her feet, arm, dry scaly skins, there is also raised erythematous dry scaly rash at bilateral leg, she reported similar reaction to interferon for hepatitis C treatment in the past, she was seen by her dermatologist Dr.Walter Elvera Lennox in recent few months, was given the diagnosis of eczema, given a prescription of dexamethasone cream.   Patient stated, [facility] home infusion agent is planning on switching from Octgam to different 10% IVIG privigen, she complains of excessive stress, depression, " she feel manner just want to stay in bed all the time"    IVIG infusion in June did help her some, she feel more energetic,  Less lower extremity spasticity, paresthesia,   MRI of thoracic spine on October 29, 2017 was normal   UPDATE Apr 22 2018: She continued her IVIG infusion, did notice less fatigue, lessening paresthesia and pain, she is going through a lot of stress, complains of worsening anxiety, she wished to continue IVIG treatment, but has to switch to a different provider in [facility] system for better coverage from her insurance company,   Update June 01, 2019: She has lost follow-up since last visit in December 2019 due to health insurance, she is on thyroid supplement now, she had a history of chronic diffuse body achy pain, fibromyalgia, was under pain management, also see psychiatrist, taking Cymbalta 60 mg daily, gabapentin 300 mg 3 times a day, methadone 10 mg every 8 hours, Adderall 10 mg daily   She has been receiving her IVIG through home health on a monthly basis, she continues to report improvement, continue has mild gait abnormality, especially when closing her eyes   She was diagnosed with hand and feet psoriasis,   Laboratory evaluation from  [facility] system in September 2020, normal hemoglobin of 12.8, elevated triglyceride 509, total cholesterol 205, normal CMP, creatinine of 0.96, UDS was positive for methadone, amphetamine,   UPDATE July 13 2019: Patient has been receiving IVIG treatment on a monthly basis, she is doing very well, continue have mild improvement, but continue complains of bilateral upper and lower extremity paresthesia, sometimes achy pain   She had a history of chronic low back pain, worsening since September 2020, now complains of frequent radiating pain to bilateral lower extremity, difficult to sit still, or standing for too long, also has urinary urgency   She return for electrodiagnostic study today, which continues show evidence of demyelinating polyradiculoneuropathy, but mild improvement compared to previous EMG study in April 2019     UPDATE October 13 2019: She continue to improve with IVIG treatment, she tolerated treatment well, with each IVIG infusion, she felt the last bilateral upper and lower extremity paresthesia and neuropathic pain, not improved gait abnormality, she does complains of worsening low back pain, electrodiagnostic study in March 2021 showed evidence of chronic bilateral lumbosacral radiculopathy changes She also complains of urinary urgency, frequency, occasionally stress incontinence We personally reviewed MRI of lumbar spine in May 2021, evidence of  severe spinal stenosis at L4-5 due to anterolisthesis, disc protrusion, facet hypertrophy and ligamentum flavum hypertrophy.  There is moderately severe right lateral recess stenosis with probable right L5 nerve root compression.  Additionally, due to the severity of the spinal stenosis, other traversing nerve roots could be compressed.; L3-L4, there is mild to moderate spinal stenosis.  There is potential for left L4 nerve root compression. There are moderate degenerative changes at the other lumbar levels without spinal stenosis or nerve root  compression.  UPDATE June 15th 2022: Her bilateral feet lower extremity burning pain, discoloration has much improved with continued IVIG treatment, 1 g/kg every 3 to 4 weeks through home health,  The most bothersome symptom for her is worsening lower extremity pain, radiating pain to bilateral lower extremity, recent few months, she complains 24/7 10 out of 10 severe pain,  She is also going through a lot of stress, just separated from her boyfriend of 10 years, tearful during today's interview, taking Cymbalta 60 mg daily which is helpful, previously gabapentin was helpful, she worried about her difficulty concentrating, contributed to gabapentin, stopped gabapentin without advise, noticed worsening pain,  In addition, she complains of worsening urinary frequency, incontinence, was seen by neurosurgeon in the past, deemed to be a surgical candidate," I can no longer tolerate the pain, needs to have surgery now"  UPDATE May 14 2020: Heddy underwent lumbar decompression surgery in September 2022, recovering well, no longer have significant low back pain, continue have bilateral feet numbness, no longer have significant tingling or burning pain, is on Cymbalta 60 mg daily, gabapentin 300 mg 2 tablets every night,  She does complains of fatigue at home IVIG treatment, wondering if needs long-term treatment  UPDATE Sep 10 2020: He is now getting IVIG every 4 weeks, 70 mg, complains of increased bilateral lower extremity paresthesia muscle cramping at the end of 2 weeks, denies gait abnormality  She is very much concerned about her slow worsening memory loss, difficulty naming, misplace things, MoCA examination 28/30,    Update March 19, 2022 SS:   PHYSICAL EXAM  Vitals:   10/17/20 1116  BP: (!) 145/87  Pulse: 77  Weight: 153 lb 8 oz (69.6 kg)  Height: _0  (1.702 m)   Body mass index is 24.04 kg/m.  Generalized: Well developed, in no acute distress   PHYSICAL EXAMNIATION:  Gen:  NAD, conversant, well nourised, well groomed                     Cardiovascular: Regular rate rhythm, no peripheral edema, warm, nontender. Eyes: Conjunctivae clear without exudates or hemorrhage Neck: Supple, no carotid bruits. Pulmonary: Clear to auscultation bilaterally   NEUROLOGICAL EXAM:  MENTAL STATUS: Speech/cognition: Awake, alert, oriented to history taking and casual conversation.     09/10/2021   11:32 AM  Montreal Cognitive Assessment   Visuospatial/ Executive (0/5) 3  Naming (0/3) 3  Attention: Read list of digits (0/2) 2  Attention: Read list of letters (0/1) 1  Attention: Serial 7 subtraction starting at 100 (0/3) 3  Language: Repeat phrase (0/2) 2  Language : Fluency (0/1) 1  Abstraction (0/2) 2  Delayed Recall (0/5) 3  Orientation (0/6) 6  Total 26  Adjusted Score (based on education) 26      CRANIAL NERVES: CN II: Visual fields are full to confrontation.  Pupils are round equal and briskly reactive to light. CN III, IV, VI: extraocular movement are normal. No ptosis. CN V: Facial sensation is  intact to pinprick in all 3 divisions bilaterally.  CN VII: Face is symmetric with normal eye closure and smile. CN VIII: Hearing is normal to casual conversation CN IX, X: Palate elevates symmetrically. Phonation is normal. CN XI: Head turning and shoulder shrug are intact CN XII: Tongue is midline with normal movements and no atrophy.  MOTOR: There is no pronator drift of out-stretched arms. Muscle bulk and tone are normal. Muscle strength is normal.  REFLEXES: Reflexes are areflexia  SENSORY: absent vibratory sensation and pinprick at toes  COORDINATION: There is no dysmetria on finger-to-nose and heel-knee-shin.    GAIT/STANCE: She can get up from sitting position arm crossed, steady.   REVIEW OF SYSTEMS: Out of a complete 14 system review of symptoms, the patient complains only of the following symptoms, and all other reviewed systems are  negative.  Fatigue  ALLERGIES: Allergies  Allergen Reactions   Nitrofurantoin Itching    Redness in feet    Wasp Venom Protein Other (See Comments)    cellutlitis    HOME MEDICATIONS: Outpatient Medications Prior to Visit  Medication Sig Dispense Refill   amLODipine (NORVASC) 2.5 MG tablet Take 2.5 mg by mouth daily.     amphetamine-dextroamphetamine (ADDERALL) 20 MG tablet Take 10-20 mg by mouth daily.     atorvastatin (LIPITOR) 20 MG tablet Take 20 mg by mouth daily.     DULoxetine (CYMBALTA) 60 MG capsule Take 1 capsule (60 mg total) by mouth daily. (Patient taking differently: Take 120 mg by mouth daily.) 90 capsule 4   estradiol (ESTRACE) 0.1 MG/GM vaginal cream Place 1 Applicatorful vaginally once a week.     gabapentin (NEURONTIN) 300 MG capsule TAKE 1 CAPSULE(300 MG) BY MOUTH THREE TIMES DAILY 270 capsule 3   levothyroxine (SYNTHROID) 112 MCG tablet Take 112 mcg by mouth daily before breakfast.     liothyronine (CYTOMEL) 5 MCG tablet Take 2 tablets before breakfast and 1 tablet before dinner 270 tablet 0   LORazepam (ATIVAN) 2 MG tablet Take 1-2 mg by mouth 3 (three) times daily as needed for anxiety.     traZODone (DESYREL) 100 MG tablet Take 100 mg by mouth at bedtime.     No facility-administered medications prior to visit.    PAST MEDICAL HISTORY: Past Medical History:  Diagnosis Date   ADHD    Anxiety 2012   Chronic neck pain    Depression    Fibromyalgia    Hepatitis 2008   Hyperlipemia    Hypertension 2022   Hypothyroidism 1989   Neuropathy    chronic demyelinating polyradiculoneuropathy, s/p IVIG   Pneumonia 2012   Raynaud's disease    Thyroid disease     PAST SURGICAL HISTORY: Past Surgical History:  Procedure Laterality Date   arm surgery Right    Fracture   CESAREAN SECTION     HERNIA REPAIR  03/31/2021   LEG SURGERY Right    Fracture   TRANSFORAMINAL LUMBAR INTERBODY FUSION (TLIF) WITH PEDICLE SCREW FIXATION 1 LEVEL N/A 01/17/2021    Procedure: Transforaminal Lumbar Interbody Fusion Lumbar Four- Five;  Surgeon: Vallarie Mare, MD;  Location: [place];  Service: Neurosurgery;  Laterality: N/A;   TUBAL LIGATION      FAMILY HISTORY: Family History  Problem Relation Age of Onset   Heart attack Mother    Thyroid disease Mother    Stroke Father    Thyroid disease Sister     SOCIAL HISTORY: Social History   Socioeconomic History   Marital status:  Single    Spouse name: Not on file   Number of children: 2   Years of education: 14   Highest education level: Associate degree: occupational, Hotel manager, or vocational program  Occupational History   Occupation: [employer]  Tobacco Use   Smoking status: Every Day    Packs/day: 1.00    Types: Cigarettes   Smokeless tobacco: Never  Vaping Use   Vaping Use: Never used  Substance and Sexual Activity   Alcohol use: Yes    Alcohol/week: 1.0 standard drink of alcohol    Types: 1 Shots of liquor per week    Comment: 1 margarita a night   Drug use: Yes    Types: Marijuana    Comment: smokes daily   Sexual activity: Not on file  Other Topics Concern   Not on file  Social History Narrative   Lives at home with her boyfriend.   Right-handed.   1 cup caffeine per day.   Social Determinants of Health   Financial Resource Strain: Not on file  Food Insecurity: Not on file  Transportation Needs: Not on file  Physical Activity: Not on file  Stress: Not on file  Social Connections: Not on file  Intimate Partner Violence: Not on file    Butler Denmark, Laqueta Jean, [facility] [address] [place], [place] [address] [phone]

## 2022-03-19 ENCOUNTER — Ambulatory Visit: Payer: Medicare HMO | Admitting: Neurology

## 2022-06-02 ENCOUNTER — Encounter: Payer: Self-pay | Admitting: Neurology

## 2022-06-02 ENCOUNTER — Ambulatory Visit: Payer: Medicare HMO | Admitting: Neurology

## 2022-07-19 IMAGING — RF DG LUMBAR SPINE 2-3V
1 series · 3 of 3 positions shown · non-contrast
Comparison: 10/31/2019

CLINICAL DATA: Surgery, elective.  Surgical fusion at L4-L5.

EXAM:
LUMBAR SPINE - 2-3 VIEW

[Series 1: unknown protocol · 0.14mm/px · 3 of 3 slices shown]
[im 1/3]
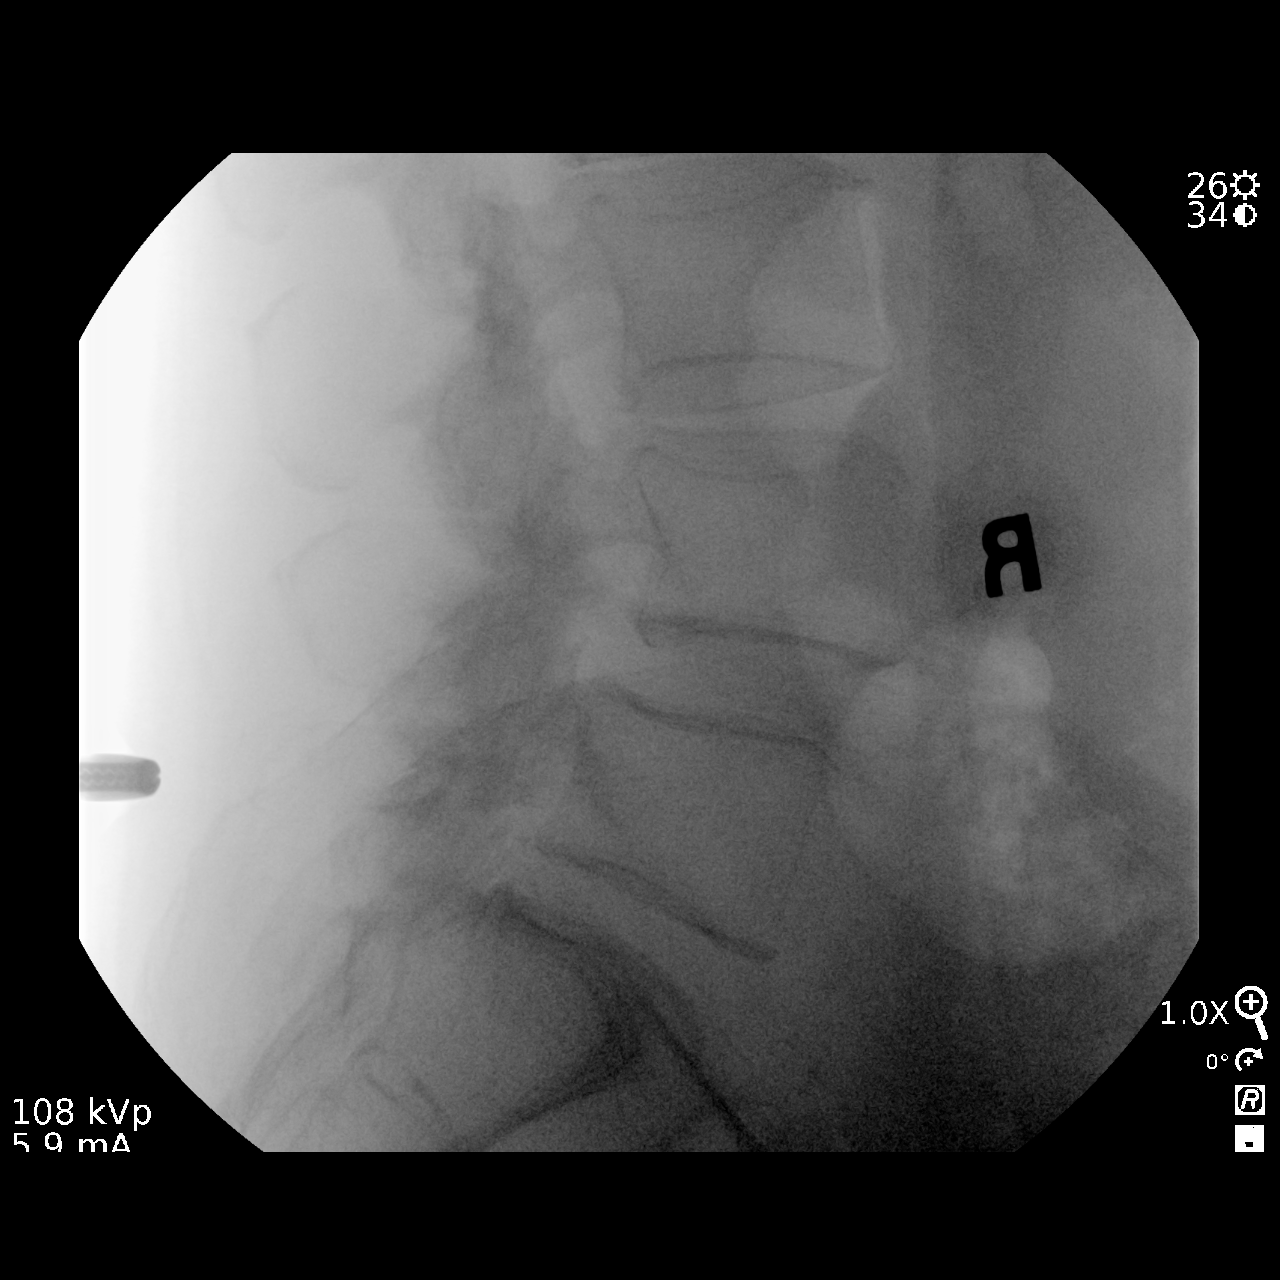
[im 2/3]
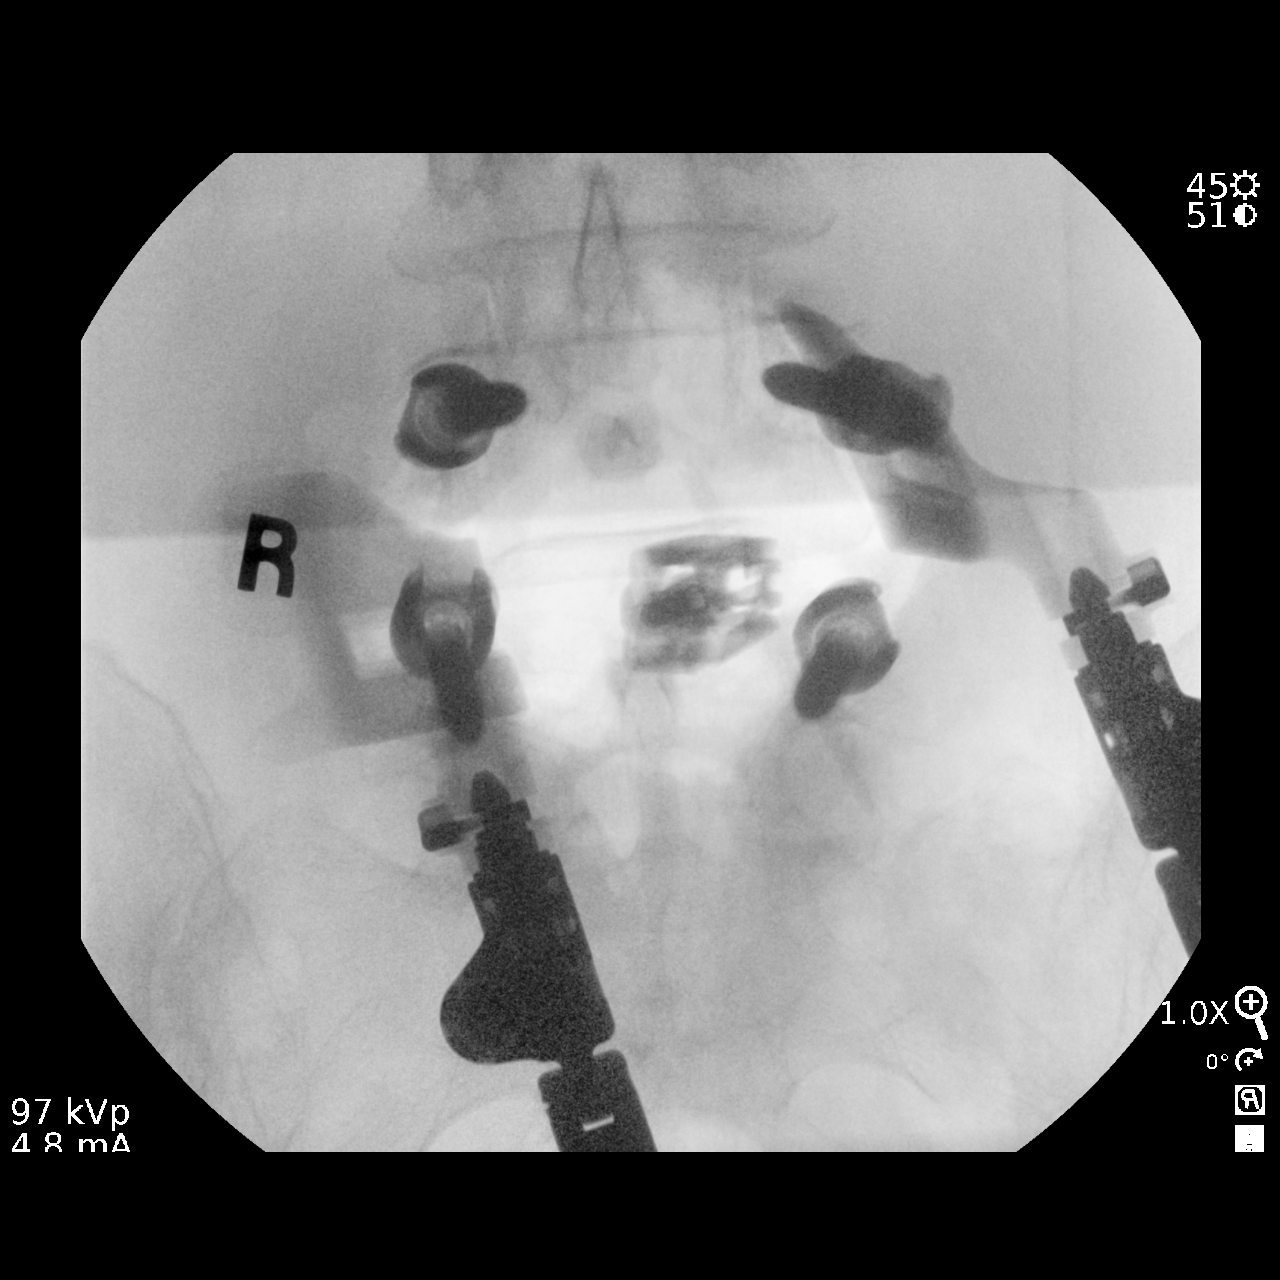
[im 3/3]
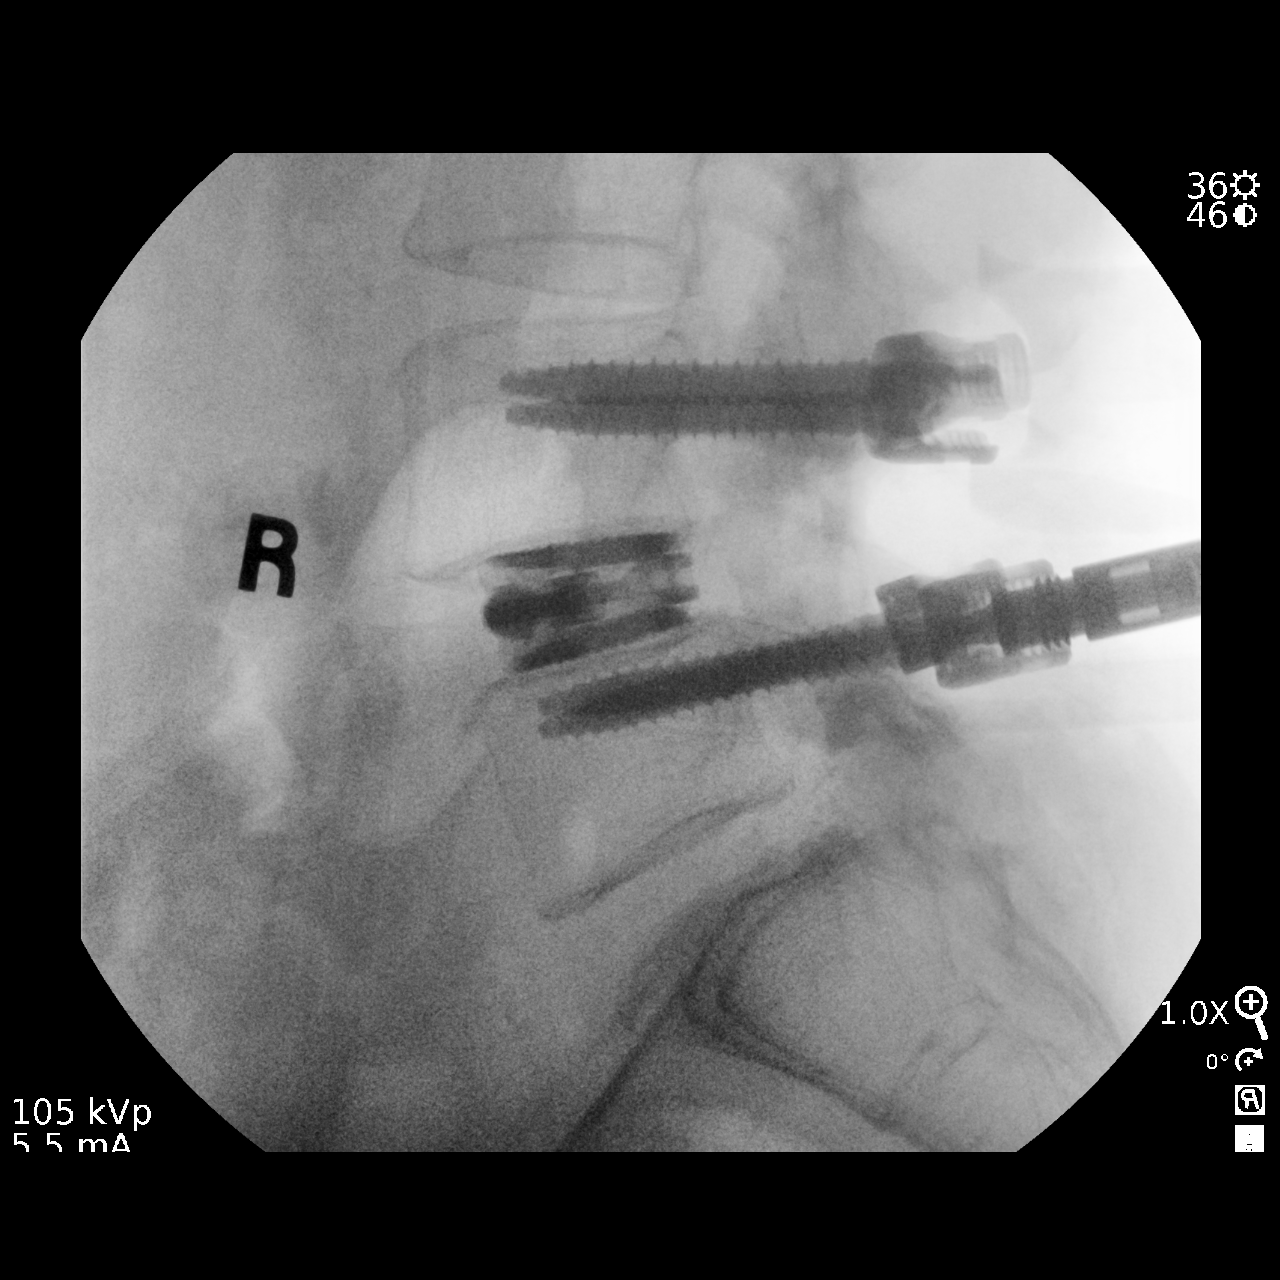

[3 of 3 positions shown; findings below may reference images not displayed]

FINDINGS: Again noted is anterolisthesis of L4 on L5. Placement of bilateral
pedicle screws at L4 and L5 with an interbody device.
IMPRESSION: Surgical fusion at L4-L5.

## 2022-09-03 ENCOUNTER — Ambulatory Visit: Payer: Medicare HMO | Admitting: Neurology

## 2022-09-15 ENCOUNTER — Telehealth: Payer: Self-pay | Admitting: Neurology

## 2022-09-15 NOTE — Telephone Encounter (Signed)
Received notice from [facility] that patient received is needing a refill on her Privigen 70g. Pharmacy has faxed over the request for the MD to sign.

## 2022-09-15 NOTE — Telephone Encounter (Signed)
Form placed in dr. Catalina Gravel office for her to sign

## 2022-09-15 NOTE — Telephone Encounter (Signed)
Call to patient to schedule appointment. No answer.

## 2022-09-16 NOTE — Telephone Encounter (Signed)
Called pt. Scheduled her with Dr. Terrace Arabia on 6/25 @ 1:00 pm. Pt said thank you so much for calling.

## 2022-10-28 ENCOUNTER — Other Ambulatory Visit: Payer: Self-pay

## 2022-10-28 ENCOUNTER — Ambulatory Visit: Payer: Medicare HMO | Admitting: Neurology

## 2022-10-28 ENCOUNTER — Encounter: Payer: Self-pay | Admitting: Neurology

## 2022-10-28 MED ORDER — GABAPENTIN 300 MG PO CAPS: ORAL_CAPSULE | ORAL | 0 refills | Status: DC

## 2022-10-28 NOTE — Progress Notes (Signed)
Patient arrived late to appointment and had to reschedule, but felt dizzy so I checked BP. It was 110/73 in the left arm while sitting. She complained of no other symptoms and stated she would go to the ER or urgent if it occurred again as I advised.She states this has happened before because she will get in a hurry and move too quickly, she was already saying she was feeling fine when I reached her. She also verbally requested gabapentin be filled until her appointment she scheduled in August. I advised her I will fill but she has to come to next appointment for further refills.

## 2023-01-06 ENCOUNTER — Encounter: Payer: Self-pay | Admitting: Neurology

## 2023-01-06 ENCOUNTER — Ambulatory Visit: Payer: Medicare HMO | Admitting: Neurology

## 2023-01-06 VITALS — BP 128/64 | Ht 67.0 in | Wt 162.0 lb

## 2023-01-06 DIAGNOSIS — R269 Unspecified abnormalities of gait and mobility: Secondary | ICD-10-CM | POA: Diagnosis not present

## 2023-01-06 DIAGNOSIS — R4189 Other symptoms and signs involving cognitive functions and awareness: Secondary | ICD-10-CM | POA: Diagnosis not present

## 2023-01-06 DIAGNOSIS — G6181 Chronic inflammatory demyelinating polyneuritis: Secondary | ICD-10-CM | POA: Diagnosis not present

## 2023-01-06 MED ORDER — GABAPENTIN 300 MG PO CAPS: 300.0000 mg | ORAL_CAPSULE | Freq: Three times a day (TID) | ORAL | 3 refills | Status: DC

## 2023-01-06 NOTE — Progress Notes (Signed)
ASSESSMENT AND PLAN 62 y.o. year old female     Chronic demyelinating polyradiculoneuropathy.  Overall doing very well  Continue home IVIG treatment, 1 g/kg was every 3-4 weeks,  Repeat EMG/NCS  History of lumbar stenosis MRI lumbar spine May 2021 showed severe stenosis at L4-5 Status post decompression in September 2022, recovering well   Mild cognitive impairment  MoCA examination 26/30   This happened in the setting of mood disorder, which likely play a role     DIAGNOSTIC DATA (LABS, IMAGING, TESTING) - I reviewed patient records, labs, notes, testing and imaging myself where available.  EMG/NCS in May 2021 This is an abnormal study.  There is evidence of chronic demyelinating polyradiculoneuropathy.  There is no evidence of axonal loss.  Compared to previous study in April 2019, there was mild to moderate improvement, as evident by increased multiple motor CMAP amplitude, improved F-wave latency, and appearance of bilateral lower extremity sensory responses.    MRI of the lumbar spine without contrast shows the following on May 8th 2021. 1.   At L4-L5, there is severe spinal stenosis due to anterolisthesis, disc protrusion, facet hypertrophy and ligamentum flavum hypertrophy.  There is moderately severe right lateral recess stenosis with probable right L5 nerve root compression.  Additionally, due to the severity of the spinal stenosis, other traversing nerve roots could be compressed. 2.   At L3-L4, there is mild to moderate spinal stenosis.  There is potential for left L4 nerve root compression. 3.   There are moderate degenerative changes at the other lumbar levels without spinal stenosis or nerve root compression..   Normal MRI thoracic spine (without) on October 29 2017   HISTORY OF PRESENT ILLNESS:  Gabriela Warfield is a 62 year old female, seen in refer by her primary care doctor Andreas Blower.  for evaluation of transient ischemic attack, initial evaluation was  on April 22, 2017.   I reviewed and summarized the referring note, she has past medical history of hypothyroidism, peripheral neuropathy, hyperlipidemia,   She works as a Armed forces operational officer for many years, around 1995, she began to experience neck pain, intermittent bilateral feet and hand paresthesia, began to seek neurological care, per patient, patient was diagnosed was carpal tunnel syndromes, peripheral neuropathy,   Then she began to develop unsteady gait,  began to fall since age 31,   She was also diagnosed with hepatitis C received treatment around 2008, around that time, she complains of excessive fatigue, generalized weakness,   In 2015, with her abnormal neck posturing, persistent neck pain, she received Botox injection in May 2015, responded very well, second injection in August 2015, has caused left neck pain, swollen, weakness, has to hold her head up with her hand   Since August 2015, she complains of frequent left-sided neck pain, radiating pain to left occipital, left parietal region, left side headaches,   She had extensive evaluations, I was able to review outside MRI cervical report October 2015, Left C3-4 facet abnormality is deep to the tender palpable  abnormality. The facet is overgrown due to degenerative change. In addition, there is bone marrow and adjacent soft tissue edema and enhancement suggesting active osteoarthritis. Underlying infection not excluded but considered less likely. Correlate with symptoms. Nevidence of discitis or abscess. There is cervical spondylosis. No other acute abnormality.  No mass or adenopathy detected.   MRI brian in Oct 2018: Mild nonspecific supratentorial small vessel disease, no contrast enhancement,   Repeat MRI in Cervcial in  June 2018.  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, with severe left foraminal stenosis, multilevel degenerative disc disease, she is now referred to neurosurgeon, pain management for epidural injection   She  continued complaints of intermittent bilateral hand and feet paresthesia, gait abnormality, but today's neurological examination are fairly normal,   Laboratory evaluations in October 2018, INR 0.9 normal CBC,   Update July 22, 2017: She is alone at today's clinical visit, pressed speech, volunteer a lot of informations, long history of ADHD, taking Adderall 10 mg daily, also on polypharmacy treatment, chronic methadone treatment 10 mg 3 times a day, also taking Ativan 1 mg every 8 hours, Cymbalta 60 mg daily, Neurontin 300 mg 3 times a day   She continue complains of neck pain, radiating pain to left shoulder, drop things from her left hand,    I was able to review the EMG nerve conduction study dated April 09, 2017 by Dr. Adele Dan, there was no evidence of left C5-6 radiculopathy, moderate carpal tunnel at the left wrist, moderate severe left ulnar neuropathy at the wrist, this is based on active neuropathic changes noted in the left deltoid, biceps, triceps, pronator teres, abductor pollicis brevis, and left cervical paraspinals,   The study showed moderately prolonged left median sensory peak latency, with severely prolonged left median motor distal latency, normal C map amplitude, mild slow conduction velocity.   She previously received the Botox injection for her neck pain, abnormal neck posturing in May again August 2015 by outside neurologist Dr. Rozelle Logan, Tawanna Cooler, I was able to review injection note, 1. Bilateral Upper trapezius received 40 units each side 2. Splenius Capiti received 20 units each side 3. Longissimus 20 units each side 4. Semispinalis Capitis 20 units side   She reported since her injection in August 2015, she had a knot at left neck from injection, been persistent, continue have significant neck pain, frequent headaches,   We have personally reviewed MRI of the brain with and without contrast in October 2018: No acute abnormality, scattered subcortical hyperintense  T2 signal changes, largest is at left anterior temporal lobe, no contrast enhancement, most consistent with small vessel disease.   MRI of cervical spine multilevel degenerative changes, left facet arthritis at C3-4 with severe left foraminal stenosis, there is no significant canal stenosis.   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  There is also suggestion of temporal dispersion at bilateral tibial proximal stimulation sites.   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 Reather Littler on October 08, 2017, decreased TSH, there was suggestion of her thyroid supplement change, the patient did not make any changes yet,   She continue has mood swings, pressed speech, complains of bilateral feet paresthesia, gait abnormality, urinary urgency, she did have significant hyperreflexia of bilateral patella but absent ankle reflexes, less dependent sensory changes, MRI  of the brain and cervical findings would not explain her hyperreflexia, ordered MRI of thoracic spine   UPDATE November 26 2017: She had her first home IVIG infusion through [facility] on June 13, 14, a week after infusion, she reported doing well, however, about 3 weeks after the infusion, in early July 2019 she noticed a rash broke out at the bottom of  her feet, arm, dry scaly skins, there is also raised erythematous dry scaly rash at bilateral leg, she reported similar reaction to interferon for hepatitis C treatment in the past, she was seen by her dermatologist Dr.Walter Doreen Beam in recent few months, was given the diagnosis of eczema, given a prescription of dexamethasone cream.   Patient stated, [facility] home infusion agent is planning on switching from Octgam to different 10% IVIG privigen, she complains of excessive stress, depression, " she feel manner just want to stay in bed all the time"    IVIG infusion in June did help her some, she feel more energetic,  Less lower extremity spasticity, paresthesia,   MRI of thoracic spine on October 29, 2017 was normal   UPDATE Apr 22 2018: She continued her IVIG infusion, did notice less fatigue, lessening paresthesia and pain, she is going through a lot of stress, complains of worsening anxiety, she wished to continue IVIG treatment, but has to switch to a different provider in [facility] system for better coverage from her insurance company,   Update June 01, 2019: She has lost follow-up since last visit in December 2019 due to health insurance, she is on thyroid supplement now, she had a history of chronic diffuse body achy pain, fibromyalgia, was under pain management, also see psychiatrist, taking Cymbalta 60 mg daily, gabapentin 300 mg 3 times a day, methadone 10 mg every 8 hours, Adderall 10 mg daily   She has been receiving her IVIG through home health on a monthly basis, she continues to report improvement, continue has mild gait abnormality, especially when closing her eyes   She was diagnosed with hand and feet psoriasis,   Laboratory evaluation from [facility] system in September 2020, normal hemoglobin of 12.8, elevated triglyceride 509, total cholesterol 205, normal CMP, creatinine of 0.96, UDS was positive for methadone, amphetamine,   UPDATE July 13 2019: Patient has been  receiving IVIG treatment on a monthly basis, she is doing very well, continue have mild improvement, but continue complains of bilateral upper and lower extremity paresthesia, sometimes achy pain   She had a history of chronic low back pain, worsening since September 2020, now complains of frequent radiating pain to bilateral lower extremity, difficult to sit still, or standing for too long, also has urinary urgency   She return for electrodiagnostic study today, which continues show evidence of demyelinating polyradiculoneuropathy, but mild improvement compared to previous EMG study in April 2019     UPDATE October 13 2019: She continue to improve with IVIG treatment, she tolerated treatment well, with each IVIG infusion, she felt the last bilateral upper and lower extremity paresthesia and neuropathic pain, not improved gait abnormality, she does complains of worsening low back pain, electrodiagnostic study in March 2021 showed evidence of chronic bilateral lumbosacral radiculopathy changes She also complains of urinary urgency, frequency, occasionally stress incontinence We personally reviewed MRI of lumbar spine in May 2021, evidence of severe spinal stenosis at L4-5 due to anterolisthesis, disc protrusion, facet hypertrophy and ligamentum flavum hypertrophy.  There is moderately severe right lateral recess stenosis with probable right L5 nerve root  compression.  Additionally, due to the severity of the spinal stenosis, other traversing nerve roots could be compressed.; L3-L4, there is mild to moderate spinal stenosis.  There is potential for left L4 nerve root compression. There are moderate degenerative changes at the other lumbar levels without spinal stenosis or nerve root compression.  UPDATE June 15th 2022: Her bilateral feet lower extremity burning pain, discoloration has much improved with continued IVIG treatment, 1 g/kg every 3 to 4 weeks through home health,  The most bothersome symptom  for her is worsening lower extremity pain, radiating pain to bilateral lower extremity, recent few months, she complains 24/7 10 out of 10 severe pain,  She is also going through a lot of stress, just separated from her boyfriend of 10 years, tearful during today's interview, taking Cymbalta 60 mg daily which is helpful, previously gabapentin was helpful, she worried about her difficulty concentrating, contributed to gabapentin, stopped gabapentin without advise, noticed worsening pain,  In addition, she complains of worsening urinary frequency, incontinence, was seen by neurosurgeon in the past, deemed to be a surgical candidate," I can no longer tolerate the pain, needs to have surgery now"  UPDATE May 14 2020: Akelia underwent lumbar decompression surgery in September 2022, recovering well, no longer have significant low back pain, continue have bilateral feet numbness, no longer have significant tingling or burning pain, is on Cymbalta 60 mg daily, gabapentin 300 mg 2 tablets every night,  She does complains of fatigue at home IVIG treatment, wondering if needs long-term treatment  UPDATE Sep 10 2020: She is now getting IVIG every 4 weeks, 70 mg, complains of increased bilateral lower extremity paresthesia muscle cramping at the end of 2 weeks, denies gait abnormality  She is very much concerned about her slow worsening memory loss, difficulty naming, misplace things, MoCA examination 28/30,    UPDATE Sept 3 2024: She continued her iVIG outpatient infusion 1 g/kg tolerating it well, still complains of bilateral feet numbness, mild unsteady gait, had a history of lumbar decompression for spinal stenosis in the past  She complains of worsening mood disorder, tearful at today's visit continue concern for mild cognitive impairment  PHYSICAL EXAM  Vitals:   10/17/20 1116  BP: (!) 145/87  Pulse: 77  Weight: 153 lb 8 oz (69.6 kg)  Height: 5\' 7"  (1.702 m)   Body mass index is 24.04  kg/m.  Generalized: Well developed, in no acute distress   PHYSICAL EXAMNIATION:  Gen: NAD, conversant, well nourised, well groomed                     Cardiovascular: Regular rate rhythm, no peripheral edema, warm, nontender. Eyes: Conjunctivae clear without exudates or hemorrhage Neck: Supple, no carotid bruits. Pulmonary: Clear to auscultation bilaterally   NEUROLOGICAL EXAM:  MENTAL STATUS: Speech/cognition: Tearful,  oriented to history taking and casual conversation.     01/06/2023    3:23 PM 09/10/2021   11:32 AM  Montreal Cognitive Assessment   Visuospatial/ Executive (0/5) 4 3  Naming (0/3) 2 3  Attention: Read list of digits (0/2) 2 2  Attention: Read list of letters (0/1) 1 1  Attention: Serial 7 subtraction starting at 100 (0/3) 3 3  Language: Repeat phrase (0/2) 1 2  Language : Fluency (0/1) 1 1  Abstraction (0/2) 2 2  Delayed Recall (0/5) 4 3  Orientation (0/6) 6 6  Total 26 26  Adjusted Score (based on education)  26      CRANIAL  NERVES: CN II: Visual fields are full to confrontation.  Pupils are round equal and briskly reactive to light. CN III, IV, VI: extraocular movement are normal. No ptosis. CN V: Facial sensation is intact to pinprick in all 3 divisions bilaterally.  CN VII: Face is symmetric with normal eye closure and smile. CN VIII: Hearing is normal to casual conversation CN IX, X: Palate elevates symmetrically. Phonation is normal. CN XI: Head turning and shoulder shrug are intact CN XII: Tongue is midline with normal movements and no atrophy.  MOTOR: She has mild toe flexion extension weakness  REFLEXES: Reflexes are areflexia  SENSORY: absent vibratory sensation and pinprick at toes, preserved to toe vibratory sensation, length-dependent decreased pinprick to distal shin level,  COORDINATION: There is no dysmetria on finger-to-nose and heel-knee-shin.    GAIT/STANCE: She can get up from sitting position arm crossed, steady.  Difficulty  standing up on tiptoes and heels,   REVIEW OF SYSTEMS: Out of a complete 14 system review of symptoms, the patient complains only of the following symptoms, and all other reviewed systems are negative.  Fatigue  ALLERGIES: Allergies  Allergen Reactions   Nitrofurantoin Itching    Redness in feet    Wasp Venom Protein Other (See Comments)    cellutlitis    HOME MEDICATIONS: Outpatient Medications Prior to Visit  Medication Sig Dispense Refill   amLODipine (NORVASC) 2.5 MG tablet Take 2.5 mg by mouth daily.     amphetamine-dextroamphetamine (ADDERALL) 20 MG tablet Take 10-20 mg by mouth daily.     Apoaequorin (PREVAGEN) 10 MG CAPS Take by mouth.     atorvastatin (LIPITOR) 20 MG tablet Take 20 mg by mouth daily.     DULoxetine (CYMBALTA) 60 MG capsule Take 1 capsule (60 mg total) by mouth daily. (Patient taking differently: Take 120 mg by mouth daily.) 90 capsule 4   estradiol (ESTRACE) 0.1 MG/GM vaginal cream Place 1 Applicatorful vaginally once a week.     gabapentin (NEURONTIN) 300 MG capsule TAKE 1 CAPSULE(300 MG) BY MOUTH THREE TIMES DAILY 270 capsule 0   IMMUNE GLOBULIN 10% 10 GM/100ML SOLN Inject 70 g into the vein as directed. Every 3 weeks     levothyroxine (SYNTHROID) 112 MCG tablet Take 112 mcg by mouth daily before breakfast.     liothyronine (CYTOMEL) 5 MCG tablet Take 2 tablets before breakfast and 1 tablet before dinner 270 tablet 0   LORazepam (ATIVAN) 2 MG tablet Take 1-2 mg by mouth 3 (three) times daily as needed for anxiety.     risankizumab-rzaa (SKYRIZI PEN) 150 MG/ML pen Inject 150 mg into the skin as directed. Every 3 months     traZODone (DESYREL) 100 MG tablet Take 100 mg by mouth at bedtime.     No facility-administered medications prior to visit.    PAST MEDICAL HISTORY: Past Medical History:  Diagnosis Date   ADHD    Anxiety 2012   Chronic neck pain    Depression    Fibromyalgia    Hepatitis 2008   Hyperlipemia    Hypertension 2022    Hypothyroidism 1989   Neuropathy    chronic demyelinating polyradiculoneuropathy, s/p IVIG   Pneumonia 2012   Raynaud's disease    Thyroid disease     PAST SURGICAL HISTORY: Past Surgical History:  Procedure Laterality Date   arm surgery Right    Fracture   CESAREAN SECTION     HERNIA REPAIR  03/31/2021   LEG SURGERY Right    Fracture  TRANSFORAMINAL LUMBAR INTERBODY FUSION (TLIF) WITH PEDICLE SCREW FIXATION 1 LEVEL N/A 01/17/2021   Procedure: Transforaminal Lumbar Interbody Fusion Lumbar Four- Five;  Surgeon: Bedelia Person, MD;  Location: [facility] OR;  Service: Neurosurgery;  Laterality: N/A;   TUBAL LIGATION      FAMILY HISTORY: Family History  Problem Relation Age of Onset   Heart attack Mother    Thyroid disease Mother    Stroke Father    Thyroid disease Sister     SOCIAL HISTORY: Social History   Socioeconomic History   Marital status: Single    Spouse name: Not on file   Number of children: 2   Years of education: 14   Highest education level: Associate degree: occupational, Scientist, product/process development, or vocational program  Occupational History   Occupation: Special educational needs teacher [employer]  Tobacco Use   Smoking status: Every Day    Current packs/day: 1.00    Types: Cigarettes   Smokeless tobacco: Never  Vaping Use   Vaping status: Never Used  Substance and Sexual Activity   Alcohol use: Yes    Alcohol/week: 1.0 standard drink of alcohol    Types: 1 Shots of liquor per week    Comment: 1 margarita a night   Drug use: Yes    Types: Marijuana    Comment: smokes daily   Sexual activity: Not on file  Other Topics Concern   Not on file  Social History Narrative   Lives at home with her boyfriend.   Right-handed.   1 cup caffeine per day.   Social Determinants of Health   Financial Resource Strain: Not on file  Food Insecurity: Low Risk  (09/08/2022)   [facility]   Hunger Vital Sign    Worried About Running Out of Food in the Last Year: Never  true    Ran Out of Food in the Last Year: Never true  Transportation Needs: No Transportation Needs (09/08/2022)   [facility]   Transportation    In the past 12 months, has lack of reliable transportation kept you from medical appointments, meetings, work or from getting things needed for daily living? : No  Physical Activity: Not on file  Stress: Not on file  Social Connections: Not on file  Intimate Partner Violence: Not on file   Levert Feinstein, M.D. Ph.D.  [facility] [address] [place], [place] [address] Phone: [phone] Fax:      [phone]

## 2023-03-11 ENCOUNTER — Ambulatory Visit (INDEPENDENT_AMBULATORY_CARE_PROVIDER_SITE_OTHER): Payer: Medicare HMO | Admitting: Neurology

## 2023-03-11 ENCOUNTER — Ambulatory Visit: Payer: Self-pay | Admitting: Neurology

## 2023-03-11 VITALS — BP 134/77 | HR 66 | Ht 67.0 in | Wt 166.0 lb

## 2023-03-11 DIAGNOSIS — R4189 Other symptoms and signs involving cognitive functions and awareness: Secondary | ICD-10-CM

## 2023-03-11 DIAGNOSIS — R269 Unspecified abnormalities of gait and mobility: Secondary | ICD-10-CM

## 2023-03-11 DIAGNOSIS — G6181 Chronic inflammatory demyelinating polyneuritis: Secondary | ICD-10-CM

## 2023-03-11 DIAGNOSIS — Z0289 Encounter for other administrative examinations: Secondary | ICD-10-CM

## 2023-03-11 NOTE — Procedures (Signed)
Full Name: Haley Valdez Gender: Female MRN #: [id number] Date of Birth: 1961/02/01    Visit Date: 03/11/2023 12:31 Age: 62 Years Examining Physician: Dr. Levert Feinstein Referring Physician: Dr. Levert Feinstein Height: 5 feet 7 inch History: 62 year old female with history of lumbar decompression surgery, CIDP, continued IVIG treatment, continue complains of bilateral lower extremity paresthesia, cold sensation, gait abnormality  Summary of the test:  Nerve conduction study: Right sural, superficial peroneal sensory responses were absent. Right peroneal, tibial motor responses were absent.  Right ulnar sensory responses showed moderately prolonged peak latency, with normal snap amplitude. Right radial and median sensory responses showed mild to moderately prolonged peak latency with mildly decreased snap amplitude.  Right median, ulnar motor responses showed significantly prolonged distal latency, within normal range CMAP amplitude, moderately slow conduction velocity.  Right ulnar F-wave latency was moderately prolonged.  Electromyography: Selected needle examinations of right lower extremity muscles and upper extremity muscles were performed.  There is evidence of chronic neuropathic changes involving right lower extremity muscles.  There were also mild chronic neuropathic changes involving right first dorsal interossei and right abductor pollicis brevis, no evidence of active denervation.  Conclusion: This is an abnormal study.  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    ------------------------------- Levert Feinstein, M.D. PhD  [facility] [address], [address] [place], [place] [address] Tel: [phone] Fax: [phone]  Verbal informed  consent was obtained from the patient, patient was informed of potential risk of procedure, including bruising, bleeding, hematoma formation, infection, muscle weakness, muscle pain, numbness, among others.        MNC    Nerve / Sites Muscle Latency Ref. Amplitude Ref. Rel Amp Segments Distance Velocity Ref. Area    ms ms mV mV %  cm m/s m/s mVms  R Median - APB     Wrist APB 7.0 <=4.4 4.1 >=4.0 100 Wrist - APB 7   18.3     Upper arm APB 13.3  5.0  121 Upper arm - Wrist 24 38 >=49 24.5  R Ulnar - ADM     Wrist ADM 5.6 <=3.3 5.2 >=6.0 100 Wrist - ADM 7   20.3     B.Elbow ADM 8.8  5.0  95 B.Elbow - Wrist 13 41 >=49 21.5     A.Elbow ADM 13.1  4.5  90.6 A.Elbow - B.Elbow 16 37 >=49 22.0  R Peroneal - EDB     Ankle EDB NR <=6.5 NR >=2.0 NR Ankle - EDB 9   NR         Pop fossa - Ankle      R Tibial - AH     Ankle AH NR <=5.8 NR >=4.0 NR Ankle - AH 9   NR             SNC    Nerve / Sites Rec. Site Peak Lat Ref.  Amp Ref. Segments Distance    ms ms V V  cm  R Radial - Anatomical snuff box (Forearm)     Forearm Wrist 3.1 <=2.9 9 >=15 Forearm - Wrist 10  R Sural - Ankle (Calf)     Calf Ankle NR <=4.4 NR >=6 Calf - Ankle 14  R Superficial peroneal - Ankle     Lat leg Ankle NR <=4.4 NR >=6  Lat leg - Ankle 14  R Median - Orthodromic (Dig II, Mid palm)     Dig II Wrist 5.3 <=3.4 6 >=10 Dig II - Wrist 13  R Ulnar - Orthodromic, (Dig V, Mid palm)     Dig V Wrist 4.1 <=3.1 5 >=5 Dig V - Wrist 53               F  Wave    Nerve F Lat Ref.   ms ms  R Ulnar - ADM 35.6 <=32.0       EMG Summary Table    Spontaneous MUAP Recruitment  Muscle IA Fib PSW Fasc Other Amp Dur. Poly Pattern  R. Tibialis anterior Normal None None None _______ Normal Normal Normal Reduced  R. Tibialis posterior Normal None None None _______ Normal Normal Normal Reduced  R. Peroneus longus Normal None None None _______ Normal Normal Normal Reduced  R. Gastrocnemius (Medial head) Normal None None None _______ Normal  Normal Normal Reduced  R. Vastus lateralis Normal None None None _______ Normal Normal Normal Reduced  R. Lumbar paraspinals (low) Normal None None None _______ Normal Normal Normal Normal  R. Lumbar paraspinals (mid) Normal None None None _______ Normal Normal Normal Normal  R. First dorsal interosseous Normal None None None _______ Normal Normal Normal Reduced  R. Abductor pollicis brevis Normal None None None _______ Normal Normal Normal Normal  R. Extensor digitorum communis Normal None None None _______ Normal Normal Normal Normal  R. Biceps brachii Normal None None None _______ Normal Normal Normal Normal  R. Deltoid Normal None None None _______ Normal Normal Normal Normal  R. Triceps brachii Normal None None None _______ Normal Normal Normal Normal  R. Cervical paraspinals Normal None None None _______ Normal Normal Normal Normal

## 2023-03-11 NOTE — Progress Notes (Signed)
ASSESSMENT AND PLAN 62 y.o. year old female     Chronic demyelinating polyradiculoneuropathy.  History of lumbar stenosis , Status post decompression in September 2022 for severe lumbar stenosis at L4-5   Depression anxiety  Overall doing very well  Continue home IVIG treatment, 1 g/kg was every 3-4 weeks,  Repeat EMG/NCS in November 2024 showed no significant change compared to previous study in 2021,  She complains of some subjective weakness in the setting of worsening depression anxiety, strained relationship, which likely contributed to her complaints,  Referred to physical therapy,    Return To Clinic With NP In 6 Months if she continues to complains of worsening of functional status, may consider Vyvgart Hytrulo treatment to replace IVIG      DIAGNOSTIC DATA (LABS, IMAGING, TESTING) - I reviewed patient records, labs, notes, testing and imaging myself where available.      MRI of the lumbar spine without contrast shows the following on May 8th 2021. 1.   At L4-L5, there is severe spinal stenosis due to anterolisthesis, disc protrusion, facet hypertrophy and ligamentum flavum hypertrophy.  There is moderately severe right lateral recess stenosis with probable right L5 nerve root compression.  Additionally, due to the severity of the spinal stenosis, other traversing nerve roots could be compressed. 2.   At L3-L4, there is mild to moderate spinal stenosis.  There is potential for left L4 nerve root compression. 3.   There are moderate degenerative changes at the other lumbar levels without spinal stenosis or nerve root compression..   Normal MRI thoracic spine (without) on October 29 2017   HISTORY OF PRESENT ILLNESS:  Haley Valdez is a 62 year old female, seen in refer by her primary care doctor Andreas Blower.  for evaluation of transient ischemic attack, initial evaluation was on April 22, 2017.   I reviewed and summarized the referring note, she has past  medical history of hypothyroidism, peripheral neuropathy, hyperlipidemia,   She works as a Armed forces operational officer for many years, around 1995, she began to experience neck pain, intermittent bilateral feet and hand paresthesia, began to seek neurological care, per patient, patient was diagnosed was carpal tunnel syndromes, peripheral neuropathy,   Then she began to develop unsteady gait,  began to fall since age 78,   She was also diagnosed with hepatitis C received treatment around 2008, around that time, she complains of excessive fatigue, generalized weakness,   In 2015, with her abnormal neck posturing, persistent neck pain, she received Botox injection in May 2015, responded very well, second injection in August 2015, has caused left neck pain, swollen, weakness, has to hold her head up with her hand   Since August 2015, she complains of frequent left-sided neck pain, radiating pain to left occipital, left parietal region, left side headaches,   She had extensive evaluations, I was able to review outside MRI cervical report October 2015, Left C3-4 facet abnormality is deep to the tender palpable  abnormality. The facet is overgrown due to degenerative change. In addition, there is bone marrow and adjacent soft tissue edema and enhancement suggesting active osteoarthritis. Underlying infection not excluded but considered less likely. Correlate with symptoms. Nevidence of discitis or abscess. There is cervical spondylosis. No other acute abnormality.  No mass or adenopathy detected.   MRI brian in Oct 2018: Mild nonspecific supratentorial small vessel disease, no contrast enhancement,   Repeat MRI in Cervcial in June 2018.  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, with severe left  foraminal stenosis, multilevel degenerative disc disease, she is now referred to neurosurgeon, pain management for epidural injection   She continued complaints of intermittent bilateral hand and feet paresthesia, gait  abnormality, but today's neurological examination are fairly normal,   Laboratory evaluations in October 2018, INR 0.9 normal CBC,   Update July 22, 2017: She is alone at today's clinical visit, pressed speech, volunteer a lot of informations, long history of ADHD, taking Adderall 10 mg daily, also on polypharmacy treatment, chronic methadone treatment 10 mg 3 times a day, also taking Ativan 1 mg every 8 hours, Cymbalta 60 mg daily, Neurontin 300 mg 3 times a day   She continue complains of neck pain, radiating pain to left shoulder, drop things from her left hand,    I was able to review the EMG nerve conduction study dated April 09, 2017 by Dr. Adele Dan, there was no evidence of left C5-6 radiculopathy, moderate carpal tunnel at the left wrist, moderate severe left ulnar neuropathy at the wrist, this is based on active neuropathic changes noted in the left deltoid, biceps, triceps, pronator teres, abductor pollicis brevis, and left cervical paraspinals,   The study showed moderately prolonged left median sensory peak latency, with severely prolonged left median motor distal latency, normal C map amplitude, mild slow conduction velocity.   She previously received the Botox injection for her neck pain, abnormal neck posturing in May again August 2015 by outside neurologist Dr. Rozelle Logan, Tawanna Cooler, I was able to review injection note, 1. Bilateral Upper trapezius received 40 units each side 2. Splenius Capiti received 20 units each side 3. Longissimus 20 units each side 4. Semispinalis Capitis 20 units side   She reported since her injection in August 2015, she had a knot at left neck from injection, been persistent, continue have significant neck pain, frequent headaches,   We have personally reviewed MRI of the brain with and without contrast in October 2018: No acute abnormality, scattered subcortical hyperintense T2 signal changes, largest is at left anterior temporal lobe, no contrast  enhancement, most consistent with small vessel disease.   MRI of cervical spine multilevel degenerative changes, left facet arthritis at C3-4 with severe left foraminal stenosis, there is no significant canal stenosis.   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  There is also suggestion of temporal dispersion at bilateral tibial proximal stimulation sites.   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 Reather Littler on October 08, 2017, decreased TSH, there was suggestion of her thyroid supplement change, the patient did not make any changes yet,   She continue has mood swings, pressed speech, complains of bilateral feet paresthesia, gait abnormality, urinary urgency, she did have significant hyperreflexia of bilateral patella but absent ankle reflexes, less dependent sensory changes, MRI of the brain and cervical findings would not explain her hyperreflexia, ordered  MRI of thoracic spine   UPDATE November 26 2017: She had her first home IVIG infusion through [facility] on June 13, 14, a week after infusion, she reported doing well, however, about 3 weeks after the infusion, in early July 2019 she noticed a rash broke out at the bottom of her feet, arm, dry scaly skins, there is also raised erythematous dry scaly  rash at bilateral leg, she reported similar reaction to interferon for hepatitis C treatment in the past, she was seen by her dermatologist Dr.Walter Doreen Beam in recent few months, was given the diagnosis of eczema, given a prescription of dexamethasone cream.   Patient stated, [facility] home infusion agent is planning on switching from Octgam to different 10% IVIG privigen, she complains of excessive stress, depression, " she feel manner just want to stay in bed all the time"    IVIG infusion in June did help her some, she feel more energetic,  Less lower extremity spasticity, paresthesia,   MRI of thoracic spine on October 29, 2017 was normal   UPDATE Apr 22 2018: She continued her IVIG infusion, did notice less fatigue, lessening paresthesia and pain, she is going through a lot of stress, complains of worsening anxiety, she wished to continue IVIG treatment, but has to switch to a different provider in [facility] system for better coverage from her insurance company,   Update June 01, 2019: She has lost follow-up since last visit in December 2019 due to health insurance, she is on thyroid supplement now, she had a history of chronic diffuse body achy pain, fibromyalgia, was under pain management, also see psychiatrist, taking Cymbalta 60 mg daily, gabapentin 300 mg 3 times a day, methadone 10 mg every 8 hours, Adderall 10 mg daily   She has been receiving her IVIG through home health on a monthly basis, she continues to report improvement, continue has mild gait abnormality, especially when closing her eyes   She was diagnosed with hand and feet psoriasis,   Laboratory evaluation from [facility] system in September 2020, normal hemoglobin of 12.8, elevated triglyceride 509, total cholesterol 205, normal CMP, creatinine of 0.96, UDS was positive for methadone, amphetamine,   UPDATE July 13 2019: Patient has been receiving IVIG treatment on a monthly basis, she is doing very well, continue have  mild improvement, but continue complains of bilateral upper and lower extremity paresthesia, sometimes achy pain   She had a history of chronic low back pain, worsening since September 2020, now complains of frequent radiating pain to bilateral lower extremity, difficult to sit still, or standing for too long, also has urinary urgency   She return for electrodiagnostic study today, which continues show evidence of demyelinating polyradiculoneuropathy, but mild improvement compared to previous EMG study in April 2019     UPDATE October 13 2019: She continue to improve with IVIG treatment, she tolerated treatment well, with each IVIG infusion, she felt the last bilateral upper and lower extremity paresthesia and neuropathic pain, not improved gait abnormality, she does complains of worsening low back pain, electrodiagnostic study in March 2021 showed evidence of chronic bilateral lumbosacral radiculopathy changes She also complains of urinary urgency, frequency, occasionally stress incontinence We personally reviewed MRI of lumbar spine in May 2021, evidence of severe spinal stenosis at L4-5 due to anterolisthesis, disc protrusion, facet hypertrophy and ligamentum flavum hypertrophy.  There is moderately severe right lateral recess stenosis with probable right L5 nerve root compression.  Additionally, due to the severity of the spinal stenosis, other  traversing nerve roots could be compressed.; L3-L4, there is mild to moderate spinal stenosis.  There is potential for left L4 nerve root compression. There are moderate degenerative changes at the other lumbar levels without spinal stenosis or nerve root compression.  UPDATE June 15th 2022: Her bilateral feet lower extremity burning pain, discoloration has much improved with continued IVIG treatment, 1 g/kg every 3 to 4 weeks through home health,  The most bothersome symptom for her is worsening lower extremity pain, radiating pain to bilateral lower  extremity, recent few months, she complains 24/7 10 out of 10 severe pain,  She is also going through a lot of stress, just separated from her boyfriend of 10 years, tearful during today's interview, taking Cymbalta 60 mg daily which is helpful, previously gabapentin was helpful, she worried about her difficulty concentrating, contributed to gabapentin, stopped gabapentin without advise, noticed worsening pain,  In addition, she complains of worsening urinary frequency, incontinence, was seen by neurosurgeon in the past, deemed to be a surgical candidate," I can no longer tolerate the pain, needs to have surgery now"  UPDATE May 14 2020: Prosperity underwent lumbar decompression surgery in September 2022, recovering well, no longer have significant low back pain, continue have bilateral feet numbness, no longer have significant tingling or burning pain, is on Cymbalta 60 mg daily, gabapentin 300 mg 2 tablets every night,  She does complains of fatigue at home IVIG treatment, wondering if needs long-term treatment  UPDATE Sep 10 2020: She is now getting IVIG every 4 weeks, 70 mg, complains of increased bilateral lower extremity paresthesia muscle cramping at the end of 2 weeks, denies gait abnormality  She is very much concerned about her slow worsening memory loss, difficulty naming, misplace things, MoCA examination 28/30,    UPDATE Sept 3 2024: She continued her iVIG outpatient infusion 1 g/kg tolerating it well, still complains of bilateral feet numbness, mild unsteady gait, had a history of lumbar decompression for spinal stenosis in the past  She complains of worsening mood disorder, tearful at today's visit continue concern for mild cognitive impairment  UPDATE Nov 6th 2024: Solimar again a lot of emotional distress, despite taking Cymbalta 120 mg daily, gabapentin 300 mg 3 times a day, trazodone 100 mg at bedtime, she walks her dog ' a lot " every day,  She complains of bilateral feet cold  sensation, could not get it warm, unsteady gait, had a history of right tibial and fibular fracture many years ago, felt her right leg is shorter, that contributed to her gait complaints as well  Repeat EMG nerve conduction study today continue to demonstrate chronic demyelinating polyradiculoneuropathy, compared to previous study in 2021, there was no significant improvement.  She is tolerating IVIG 1 g/kg every 3 weeks treatment well,   PHYSICAL EXAM  Vitals:   10/17/20 1116  BP: (!) 145/87  Pulse: 77  Weight: 153 lb 8 oz (69.6 kg)  Height: 5\' 7"  (1.702 m)   Body mass index is 24.04 kg/m.  Generalized: Well developed, in no acute distress   PHYSICAL EXAMNIATION:  Gen: NAD, conversant, well nourised, well groomed        NEUROLOGICAL EXAM:  MENTAL STATUS: Speech/cognition: Very anxious looking middle-age female, oriented to history taking and casual conversation.     01/06/2023    3:23 PM 09/10/2021   11:32 AM  Montreal Cognitive Assessment   Visuospatial/ Executive (0/5) 4 3  Naming (0/3) 2 3  Attention: Read list of digits (0/2) 2 2  Attention: Read list of letters (0/1) 1 1  Attention: Serial 7 subtraction starting at 100 (0/3) 3 3  Language: Repeat phrase (0/2) 1 2  Language : Fluency (0/1) 1 1  Abstraction (0/2) 2 2  Delayed Recall (0/5) 4 3  Orientation (0/6) 6 6  Total 26 26  Adjusted Score (based on education)  26      CRANIAL NERVES: CN II: Visual fields are full to confrontation.  Pupils are round equal and briskly reactive to light. CN III, IV, VI: extraocular movement are normal. No ptosis. CN V: Facial sensation is intact to pinprick in all 3 divisions bilaterally.  CN VII: Face is symmetric with normal eye closure and smile. CN VIII: Hearing is normal to casual conversation CN IX, X: Palate elevates symmetrically. Phonation is normal. CN XI: Head turning and shoulder shrug are intact CN XII: Tongue is midline with normal movements and no  atrophy.  MOTOR: She has mild toe flexion/extension weakness  REFLEXES: Reflexes are areflexia  SENSORY: absent vibratory sensation and pinprick at toes, preserved to toe proprioception, length-dependent decreased pinprick to distal shin level,  COORDINATION: There is no dysmetria on finger-to-nose and heel-knee-shin.    GAIT/STANCE: She can get up from sitting position arm crossed, steady.  Difficulty standing up on tiptoes and heels, negative Romberg signs   REVIEW OF SYSTEMS: Out of a complete 14 system review of symptoms, the patient complains only of the following symptoms, and all other reviewed systems are negative.  Fatigue  ALLERGIES: Allergies  Allergen Reactions   Nitrofurantoin Itching    Redness in feet    Wasp Venom Protein Other (See Comments)    cellutlitis    HOME MEDICATIONS: Outpatient Medications Prior to Visit  Medication Sig Dispense Refill   amLODipine (NORVASC) 2.5 MG tablet Take 2.5 mg by mouth daily.     amphetamine-dextroamphetamine (ADDERALL) 20 MG tablet Take 10-20 mg by mouth daily.     atorvastatin (LIPITOR) 20 MG tablet Take 20 mg by mouth daily.     DULoxetine (CYMBALTA) 60 MG capsule Take 1 capsule (60 mg total) by mouth daily. (Patient taking differently: Take 120 mg by mouth daily.) 90 capsule 4   estradiol (ESTRACE) 0.1 MG/GM vaginal cream Place 1 Applicatorful vaginally once a week.     gabapentin (NEURONTIN) 300 MG capsule Take 1 capsule (300 mg total) by mouth 3 (three) times daily. TAKE 1 CAPSULE(300 MG) BY MOUTH THREE TIMES DAILY 270 capsule 3   IMMUNE GLOBULIN 10% 10 GM/100ML SOLN Inject 1 g/kg into the vein as directed. 70g Every 3 weeks     levothyroxine (SYNTHROID) 112 MCG tablet Take 112 mcg by mouth daily before breakfast.     liothyronine (CYTOMEL) 5 MCG tablet Take 2 tablets before breakfast and 1 tablet before dinner 270 tablet 0   LORazepam (ATIVAN) 2 MG tablet Take 1-2 mg by mouth 3 (three) times daily as needed for anxiety.      risankizumab-rzaa (SKYRIZI PEN) 150 MG/ML pen Inject 150 mg into the skin as directed. Every 3 months     traZODone (DESYREL) 100 MG tablet Take 100 mg by mouth at bedtime.     No facility-administered medications prior to visit.    PAST MEDICAL HISTORY: Past Medical History:  Diagnosis Date   ADHD    Anxiety 2012   Chronic neck pain    Depression    Fibromyalgia    Hepatitis 2008   Hyperlipemia    Hypertension 2022   Hypothyroidism 1989  Neuropathy    chronic demyelinating polyradiculoneuropathy, s/p IVIG   Pneumonia 2012   Raynaud's disease    Thyroid disease     PAST SURGICAL HISTORY: Past Surgical History:  Procedure Laterality Date   arm surgery Right    Fracture   CESAREAN SECTION     HERNIA REPAIR  03/31/2021   LEG SURGERY Right    Fracture   TRANSFORAMINAL LUMBAR INTERBODY FUSION (TLIF) WITH PEDICLE SCREW FIXATION 1 LEVEL N/A 01/17/2021   Procedure: Transforaminal Lumbar Interbody Fusion Lumbar Four- Five;  Surgeon: Bedelia Person, MD;  [facility] OR;  Service: Neurosurgery;  Laterality: N/A;   TUBAL LIGATION      FAMILY HISTORY: Family History  Problem Relation Age of Onset   Heart attack Mother    Thyroid disease Mother    Stroke Father    Thyroid disease Sister     SOCIAL HISTORY: Social History   Socioeconomic History   Marital status: Single    Spouse name: Not on file   Number of children: 2   Years of education: 14   Highest education level: Associate degree: occupational, Scientist, product/process development, or vocational program  Occupational History   Occupation: Special educational needs teacher company  Tobacco Use   Smoking status: Every Day    Current packs/day: 1.00    Types: Cigarettes   Smokeless tobacco: Never  Vaping Use   Vaping status: Never Used  Substance and Sexual Activity   Alcohol use: Yes    Alcohol/week: 1.0 standard drink of alcohol    Types: 1 Shots of liquor per week    Comment: 1 margarita a night   Drug use: Yes    Types: Marijuana     Comment: smokes daily   Sexual activity: Not on file  Other Topics Concern   Not on file  Social History Narrative   Lives at home with her boyfriend.   Right-handed.   1 cup caffeine per day.   Social Determinants of Health   Financial Resource Strain: Not on file  Food Insecurity: Medium Risk (01/19/2023)   Received from [facility]   Hunger Vital Sign    Worried About Running Out of Food in the Last Year: Sometimes true    Ran Out of Food in the Last Year: Sometimes true  Transportation Needs: No Transportation Needs (01/19/2023)   Received from [facility]    In the past 12 months, has lack of reliable transportation kept you from medical appointments, meetings, work or from getting things needed for daily living? : No  Physical Activity: Not on file  Stress: Not on file  Social Connections: Not on file  Intimate Partner Violence: Not on file   Levert Feinstein, M.D. Ph.D.  [facility] [address] [place], [place] [address] Phone: [phone] Fax:      [phone]

## 2023-03-14 NOTE — Progress Notes (Signed)
EMG report is under procedure tab. 

## 2023-06-01 ENCOUNTER — Telehealth: Payer: Self-pay | Admitting: Anesthesiology

## 2023-06-01 NOTE — Telephone Encounter (Signed)
Form for IVIG has been faxed to [facility]. Confirmation of receipt was received.

## 2023-08-12 ENCOUNTER — Telehealth: Payer: Self-pay

## 2023-08-12 NOTE — Telephone Encounter (Signed)
 Marland Kitchen

## 2023-09-01 NOTE — Telephone Encounter (Signed)
 Faxed infusion order to [facility] [phone]

## 2023-09-29 NOTE — Progress Notes (Unsigned)
 ASSESSMENT AND PLAN 63 y.o. year old female     1.  Chronic demyelinating polyradiculoneuropathy.  2.  History of lumbar stenosis , Status post decompression in September 2022 for severe lumbar stenosis at L4-5   3.  Depression, anxiety  - Overall stable, no significant worsening in function, over time continues with balance issue -We will continue home IVIG treatment, 1 g/kg was every 3-4 weeks, - Repeat EMG/NCS in November 2024 showed no significant change compared to previous study in 2021, -Referred to physical therapy, not interested currently - Advised to continue to exercise, be active, practice safe ambulation. Just returned from vacation, walked 4 miles barefoot on the beach - If her functional status worsens, may consider Vyvgart Hytrulo in place of IVIG - Follow-up in 6 months with Dr. Gracie Lav    DIAGNOSTIC DATA (LABS, IMAGING, TESTING) - I reviewed patient records, labs, notes, testing and imaging myself where available.    MRI of the lumbar spine without contrast shows the following on May 8th 2021. 1.   At L4-L5, there is severe spinal stenosis due to anterolisthesis, disc protrusion, facet hypertrophy and ligamentum flavum hypertrophy.  There is moderately severe right lateral recess stenosis with probable right L5 nerve root compression.  Additionally, due to the severity of the spinal stenosis, other traversing nerve roots could be compressed. 2.   At L3-L4, there is mild to moderate spinal stenosis.  There is potential for left L4 nerve root compression. 3.   There are moderate degenerative changes at the other lumbar levels without spinal stenosis or nerve root compression..   Normal MRI thoracic spine (without) on October 29 2017   HISTORY OF PRESENT ILLNESS:  Haley Valdez is a 63 year old female, seen in refer by her primary care doctor Lavenia Post.  for evaluation of transient ischemic attack, initial evaluation was on April 22, 2017.   I reviewed  and summarized the referring note, she has past medical history of hypothyroidism, peripheral neuropathy, hyperlipidemia,   She works as a Armed forces operational officer for many years, around 1995, she began to experience neck pain, intermittent bilateral feet and hand paresthesia, began to seek neurological care, per patient, patient was diagnosed was carpal tunnel syndromes, peripheral neuropathy,   Then she began to develop unsteady gait,  began to fall since age 65,   She was also diagnosed with hepatitis C received treatment around 2008, around that time, she complains of excessive fatigue, generalized weakness,   In 2015, with her abnormal neck posturing, persistent neck pain, she received Botox injection in May 2015, responded very well, second injection in August 2015, has caused left neck pain, swollen, weakness, has to hold her head up with her hand   Since August 2015, she complains of frequent left-sided neck pain, radiating pain to left occipital, left parietal region, left side headaches,   She had extensive evaluations, I was able to review outside MRI cervical report October 2015, Left C3-4 facet abnormality is deep to the tender palpable  abnormality. The facet is overgrown due to degenerative change. In addition, there is bone marrow and adjacent soft tissue edema and enhancement suggesting active osteoarthritis. Underlying infection not excluded but considered less likely. Correlate with symptoms. Nevidence of discitis or abscess. There is cervical spondylosis. No other acute abnormality.  No mass or adenopathy detected.   MRI brian in Oct 2018: Mild nonspecific supratentorial small vessel disease, no contrast enhancement,   Repeat MRI in Cervcial in June 2018.  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, with severe left foraminal stenosis, multilevel degenerative disc disease, she is now referred to neurosurgeon, pain management for epidural injection   She continued complaints of intermittent  bilateral hand and feet paresthesia, gait abnormality, but today's neurological examination are fairly normal,   Laboratory evaluations in October 2018, INR 0.9 normal CBC,   Update July 22, 2017: She is alone at today's clinical visit, pressed speech, volunteer a lot of informations, long history of ADHD, taking Adderall 10 mg daily, also on polypharmacy treatment, chronic methadone treatment 10 mg 3 times a day, also taking Ativan  1 mg every 8 hours, Cymbalta  60 mg daily, Neurontin  300 mg 3 times a day   She continue complains of neck pain, radiating pain to left shoulder, drop things from her left hand,    I was able to review the EMG nerve conduction study dated April 09, 2017 by Dr. Janne Members, there was no evidence of left C5-6 radiculopathy, moderate carpal tunnel at the left wrist, moderate severe left ulnar neuropathy at the wrist, this is based on active neuropathic changes noted in the left deltoid, biceps, triceps, pronator teres, abductor pollicis brevis, and left cervical paraspinals,   The study showed moderately prolonged left median sensory peak latency, with severely prolonged left median motor distal latency, normal C map amplitude, mild slow conduction velocity.   She previously received the Botox injection for her neck pain, abnormal neck posturing in May again August 2015 by outside neurologist Dr. Doroteo Gasmen, Ena Harries, I was able to review injection note, 1. Bilateral Upper trapezius received 40 units each side 2. Splenius Capiti received 20 units each side 3. Longissimus 20 units each side 4. Semispinalis Capitis 20 units side   She reported since her injection in August 2015, she had a knot at left neck from injection, been persistent, continue have significant neck pain, frequent headaches,   We have personally reviewed MRI of the brain with and without contrast in October 2018: No acute abnormality, scattered subcortical hyperintense T2 signal changes, largest is at left  anterior temporal lobe, no contrast enhancement, most consistent with small vessel disease.   MRI of cervical spine multilevel degenerative changes, left facet arthritis at C3-4 with severe left foraminal stenosis, there is no significant canal stenosis.   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  There is also suggestion of temporal dispersion at bilateral tibial proximal stimulation sites.   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 Lajean Pike on October 08, 2017, decreased TSH, there was suggestion of her thyroid  supplement change, the patient did not make any changes yet,   She continue has mood swings, pressed speech, complains of bilateral feet paresthesia, gait abnormality, urinary urgency, she did have significant hyperreflexia of bilateral patella but absent ankle reflexes, less dependent sensory changes, MRI of the brain and cervical  findings would not explain her hyperreflexia, ordered MRI of thoracic spine   UPDATE November 26 2017: She had her first home IVIG infusion through [facility] on June 13, 14, a week after infusion, she reported doing well, however, about 3 weeks after the infusion, in early July 2019 she noticed a rash broke out at the bottom of her feet, arm, dry scaly skins, there is  also raised erythematous dry scaly rash at bilateral leg, she reported similar reaction to interferon for hepatitis C treatment in the past, she was seen by her dermatologist Dr.Walter Theron Flavin in recent few months, was given the diagnosis of eczema, given a prescription of dexamethasone  cream.   Patient stated, [facility] home infusion agent is planning on switching from Octgam to different 10% IVIG privigen, she complains of excessive stress, depression, " she feel manner just want to stay in bed all the time"    IVIG infusion in June did help her some, she feel more energetic,  Less lower extremity spasticity, paresthesia,   MRI of thoracic spine on October 29, 2017 was normal   UPDATE Apr 22 2018: She continued her IVIG infusion, did notice less fatigue, lessening paresthesia and pain, she is going through a lot of stress, complains of worsening anxiety, she wished to continue IVIG treatment, but has to switch to a different provider in [facility] system for better coverage from her insurance company,   Update June 01, 2019: She has lost follow-up since last visit in December 2019 due to health insurance, she is on thyroid  supplement now, she had a history of chronic diffuse body achy pain, fibromyalgia, was under pain management, also see psychiatrist, taking Cymbalta  60 mg daily, gabapentin  300 mg 3 times a day, methadone 10 mg every 8 hours, Adderall 10 mg daily   She has been receiving her IVIG through home health on a monthly basis, she continues to report improvement, continue has mild gait abnormality, especially when closing her eyes   She was diagnosed with hand and feet psoriasis,   Laboratory evaluation from [facility] system in September 2020, normal hemoglobin of 12.8, elevated triglyceride 509, total cholesterol 205, normal CMP, creatinine of 0.96, UDS was positive for methadone, amphetamine ,   UPDATE July 13 2019: Patient has been receiving IVIG treatment on a monthly basis, she  is doing very well, continue have mild improvement, but continue complains of bilateral upper and lower extremity paresthesia, sometimes achy pain   She had a history of chronic low back pain, worsening since September 2020, now complains of frequent radiating pain to bilateral lower extremity, difficult to sit still, or standing for too long, also has urinary urgency   She return for electrodiagnostic study today, which continues show evidence of demyelinating polyradiculoneuropathy, but mild improvement compared to previous EMG study in April 2019     UPDATE October 13 2019: She continue to improve with IVIG treatment, she tolerated treatment well, with each IVIG infusion, she felt the last bilateral upper and lower extremity paresthesia and neuropathic pain, not improved gait abnormality, she does complains of worsening low back pain, electrodiagnostic study in March 2021 showed evidence of chronic bilateral lumbosacral radiculopathy changes She also complains of urinary urgency, frequency, occasionally stress incontinence We personally reviewed MRI of lumbar spine in May 2021, evidence of severe spinal stenosis at L4-5 due to anterolisthesis, disc protrusion, facet hypertrophy and ligamentum flavum hypertrophy.  There is moderately severe right lateral recess stenosis with probable right L5 nerve root compression.  Additionally, due to  the severity of the spinal stenosis, other traversing nerve roots could be compressed.; L3-L4, there is mild to moderate spinal stenosis.  There is potential for left L4 nerve root compression. There are moderate degenerative changes at the other lumbar levels without spinal stenosis or nerve root compression.  UPDATE June 15th 2022: Her bilateral feet lower extremity burning pain, discoloration has much improved with continued IVIG treatment, 1 g/kg every 3 to 4 weeks through home health,  The most bothersome symptom for her is worsening lower extremity pain,  radiating pain to bilateral lower extremity, recent few months, she complains 24/7 10 out of 10 severe pain,  She is also going through a lot of stress, just separated from her boyfriend of 10 years, tearful during today's interview, taking Cymbalta  60 mg daily which is helpful, previously gabapentin  was helpful, she worried about her difficulty concentrating, contributed to gabapentin , stopped gabapentin  without advise, noticed worsening pain,  In addition, she complains of worsening urinary frequency, incontinence, was seen by neurosurgeon in the past, deemed to be a surgical candidate," I can no longer tolerate the pain, needs to have surgery now"  UPDATE May 14 2020: Haley Valdez underwent lumbar decompression surgery in September 2022, recovering well, no longer have significant low back pain, continue have bilateral feet numbness, no longer have significant tingling or burning pain, is on Cymbalta  60 mg daily, gabapentin  300 mg 2 tablets every night,  She does complains of fatigue at home IVIG treatment, wondering if needs long-term treatment  UPDATE Sep 10 2020: She is now getting IVIG every 4 weeks, 70 mg, complains of increased bilateral lower extremity paresthesia muscle cramping at the end of 2 weeks, denies gait abnormality  She is very much concerned about her slow worsening memory loss, difficulty naming, misplace things, MoCA examination 28/30,    UPDATE Sept 3 2024: She continued her iVIG outpatient infusion 1 g/kg tolerating it well, still complains of bilateral feet numbness, mild unsteady gait, had a history of lumbar decompression for spinal stenosis in the past  She complains of worsening mood disorder, tearful at today's visit continue concern for mild cognitive impairment  UPDATE Nov 6th 2024: Yaneliz again a lot of emotional distress, despite taking Cymbalta  120 mg daily, gabapentin  300 mg 3 times a day, trazodone  100 mg at bedtime, she walks her dog ' a lot " every day,  She  complains of bilateral feet cold sensation, could not get it warm, unsteady gait, had a history of right tibial and fibular fracture many years ago, felt her right leg is shorter, that contributed to her gait complaints as well  Repeat EMG nerve conduction study today continue to demonstrate chronic demyelinating polyradiculoneuropathy, compared to previous study in 2021, there was no significant improvement.  She is tolerating IVIG 1 g/kg every 3 weeks treatment well,  Update Sep 30, 2023 SS: Here with friend, they were late, they got a speeding ticket on the way here. Remains on every 3 weeks IVIG home health, 1 g/kg over 1 day. Not sure benefit of not taking it. Balance is main issue, doesn't notice weakness, tries to stay active, walking, exercising. Didn't go to PT. They moved in October to [facility]. Takes gabapentin  300 mg at night for paresthesia to legs, made sleepy during the day. Went to [employer] walked 4 miles barefoot.   PHYSICAL EXAM  Vitals:   10/17/20 1116  BP: (!) 145/87  Pulse: 77  Weight: 153 lb 8 oz (69.6 kg)  Height: 5\' 7"  (  1.702 m)   Body mass index is 24.04 kg/m.     01/06/2023    3:23 PM 09/10/2021   11:32 AM  Montreal Cognitive Assessment   Visuospatial/ Executive (0/5) 4 3  Naming (0/3) 2 3  Attention: Read list of digits (0/2) 2 2  Attention: Read list of letters (0/1) 1 1  Attention: Serial 7 subtraction starting at 100 (0/3) 3 3  Language: Repeat phrase (0/2) 1 2  Language : Fluency (0/1) 1 1  Abstraction (0/2) 2 2  Delayed Recall (0/5) 4 3  Orientation (0/6) 6 6  Total 26 26  Adjusted Score (based on education)  26    Physical Exam  General: The patient is alert and cooperative at the time of the examination. Anxious, scattered   Skin: No significant peripheral edema is noted.  Neurologic Exam  Mental status: The patient is alert and oriented x 3 at the time of the examination.   Cranial nerves: Facial symmetry is present. Speech is normal,  no aphasia or dysarthria is noted. Extraocular movements are full. Visual fields are full.  Motor: The patient has good strength in all 4 extremities, but mild plantar, dorsiflexion bilaterally  Sensory examination: Decreased soft touch sensation to mid shin bilaterally   Coordination: The patient has good finger-nose-finger and heel-to-shin bilaterally.  Gait and station: The patient has a normal gait.  Tandem gait is unsteady.  Mild difficulty with tiptoe and heel walk.  Reflexes: Deep tendon reflexes are decreased.  REVIEW OF SYSTEMS: Out of a complete 14 system review of symptoms, the patient complains only of the following symptoms, and all other reviewed systems are negative.  Fatigue  ALLERGIES: Allergies  Allergen Reactions   Nitrofurantoin Itching    Redness in feet    Wasp Venom Protein Other (See Comments)    cellutlitis    HOME MEDICATIONS: Outpatient Medications Prior to Visit  Medication Sig Dispense Refill   amLODipine  (NORVASC ) 2.5 MG tablet Take 2.5 mg by mouth daily.     amphetamine -dextroamphetamine  (ADDERALL) 20 MG tablet Take 10-20 mg by mouth daily.     atorvastatin  (LIPITOR) 20 MG tablet Take 20 mg by mouth daily.     DULoxetine  (CYMBALTA ) 60 MG capsule Take 1 capsule (60 mg total) by mouth daily. (Patient taking differently: Take 120 mg by mouth daily.) 90 capsule 4   estradiol  (ESTRACE ) 0.1 MG/GM vaginal cream Place 1 Applicatorful vaginally once a week.     gabapentin  (NEURONTIN ) 300 MG capsule Take 1 capsule (300 mg total) by mouth 3 (three) times daily. TAKE 1 CAPSULE(300 MG) BY MOUTH THREE TIMES DAILY 270 capsule 3   IMMUNE GLOBULIN 10% 10 GM/100ML SOLN Inject 1 g/kg into the vein as directed. 70g Every 3 weeks     levothyroxine  (SYNTHROID ) 112 MCG tablet Take 112 mcg by mouth daily before breakfast.     liothyronine  (CYTOMEL ) 5 MCG tablet Take 2 tablets before breakfast and 1 tablet before dinner 270 tablet 0   LORazepam  (ATIVAN ) 2 MG tablet Take  1-2 mg by mouth 3 (three) times daily as needed for anxiety.     risankizumab-rzaa (SKYRIZI PEN) 150 MG/ML pen Inject 150 mg into the skin as directed. Every 3 months     traZODone  (DESYREL ) 100 MG tablet Take 100 mg by mouth at bedtime.     No facility-administered medications prior to visit.    PAST MEDICAL HISTORY: Past Medical History:  Diagnosis Date   ADHD    Anxiety 2012  Chronic neck pain    Depression    Fibromyalgia    Hepatitis 2008   Hyperlipemia    Hypertension 2022   Hypothyroidism 1989   Neuropathy    chronic demyelinating polyradiculoneuropathy, s/p IVIG   Pneumonia 2012   Raynaud's disease    Thyroid  disease     PAST SURGICAL HISTORY: Past Surgical History:  Procedure Laterality Date   arm surgery Right    Fracture   CESAREAN SECTION     HERNIA REPAIR  03/31/2021   LEG SURGERY Right    Fracture   TRANSFORAMINAL LUMBAR INTERBODY FUSION (TLIF) WITH PEDICLE SCREW FIXATION 1 LEVEL N/A 01/17/2021   Procedure: Transforaminal Lumbar Interbody Fusion Lumbar Four- Five;  Surgeon: Van Gelinas, MD;  [facility];  Service: Neurosurgery;  Laterality: N/A;   TUBAL LIGATION      FAMILY HISTORY: Family History  Problem Relation Age of Onset   Heart attack Mother    Thyroid  disease Mother    Stroke Father    Thyroid  disease Sister     SOCIAL HISTORY: Social History   Socioeconomic History   Marital status: Single    Spouse name: Not on file   Number of children: 2   Years of education: 14   Highest education level: Associate degree: occupational, Scientist, product/process development, or vocational program  Occupational History   Occupation: Special educational needs teacher company  Tobacco Use   Smoking status: Every Day    Current packs/day: 1.00    Types: Cigarettes   Smokeless tobacco: Never  Vaping Use   Vaping status: Never Used  Substance and Sexual Activity   Alcohol  use: Yes    Alcohol /week: 1.0 standard drink of alcohol     Types: 1 Shots of liquor per week    Comment: 1  margarita a night   Drug use: Yes    Types: Marijuana    Comment: smokes daily   Sexual activity: Not on file  Other Topics Concern   Not on file  Social History Narrative   Lives at home with her boyfriend.   Right-handed.   1 cup caffeine per day.   Social Drivers of Corporate investment banker Strain: Not on file  Food Insecurity: Medium Risk (01/19/2023)   Received from [facility]   Hunger Vital Sign    Worried About Running Out of Food in the Last Year: Sometimes true    Ran Out of Food in the Last Year: Sometimes true  Transportation Needs: No Transportation Needs (01/19/2023)   Received from [facility]    In the past 12 months, has lack of reliable transportation kept you from medical appointments, meetings, work or from getting things needed for daily living? : No  Physical Activity: Not on file  Stress: Not on file  Social Connections: Not on file  Intimate Partner Violence: Not on file   Jeanmarie Millet, Maritza Sidles, DNP  [facility] [address], [address] [place], [place] [address] [phone]

## 2023-09-30 ENCOUNTER — Encounter: Payer: Self-pay | Admitting: Neurology

## 2023-09-30 ENCOUNTER — Telehealth: Payer: Self-pay | Admitting: Neurology

## 2023-09-30 ENCOUNTER — Ambulatory Visit (INDEPENDENT_AMBULATORY_CARE_PROVIDER_SITE_OTHER): Payer: Medicare HMO | Admitting: Neurology

## 2023-09-30 VITALS — BP 177/76 | HR 90 | Ht 67.0 in | Wt 148.4 lb

## 2023-09-30 DIAGNOSIS — R269 Unspecified abnormalities of gait and mobility: Secondary | ICD-10-CM | POA: Diagnosis not present

## 2023-09-30 DIAGNOSIS — R202 Paresthesia of skin: Secondary | ICD-10-CM | POA: Diagnosis not present

## 2023-09-30 DIAGNOSIS — G6181 Chronic inflammatory demyelinating polyneuritis: Secondary | ICD-10-CM | POA: Diagnosis not present

## 2023-09-30 NOTE — Patient Instructions (Signed)
 We will continue IVIG.  Please continue to monitor for any worsening in your symptoms or weakness.  Recommend continued exercise, be active.  We will follow-up in 6 months with Dr. Gracie Lav.  Thanks!!

## 2023-09-30 NOTE — Telephone Encounter (Signed)
 Pt called in regards to being late for appt . Pt states she might be 30 min late . Informed Sarah and front desk .

## 2023-10-09 NOTE — Progress Notes (Signed)
 Chart reviewed, agree above plan ?

## 2024-02-17 ENCOUNTER — Other Ambulatory Visit: Payer: Self-pay | Admitting: Neurology

## 2024-02-17 NOTE — Telephone Encounter (Signed)
 Last seen on 09/30/23 Follow up scheduled on 04/19/24

## 2024-02-18 ENCOUNTER — Telehealth: Payer: Self-pay | Admitting: Neurology

## 2024-02-18 NOTE — Telephone Encounter (Signed)
 [facility] (Mya) Have not been able to get in contact with patient. Wanting to know if you have any emergency contact on file for her.  Contacted the patient, she said she had sent a message this morning  but she will call them.

## 2024-02-29 ENCOUNTER — Telehealth: Payer: Self-pay | Admitting: *Deleted

## 2024-02-29 NOTE — Telephone Encounter (Signed)
 Called Comptroller. Relayed update from Dr. Onita. He verbalized understanding, nothing further needed.

## 2024-02-29 NOTE — Telephone Encounter (Signed)
 Ok to resume typical dose at next infusion

## 2024-02-29 NOTE — Telephone Encounter (Signed)
 Took call from [place]/phone room. Spoke w/ Lee/pharmacist with [facility]. She reports pt had Privigen infusion last on 02/27/24. When nurse arrived, one vial of Privigen 10g broken. Instead of 70g dose, she only received 60g for this reason. Pt typically gets infusion q 3 wk.  Nurse unable to go out to do an extra infusion to replace missed 10g. They are wanting to make Dr. Onita aware and see if she approves this one time lower dose and will resume typical dose as ordered at next infusion? Best call back# [phone].

## 2024-03-29 NOTE — Telephone Encounter (Signed)
 [facility] faxed us  a request for latest notes and labs for upcoming Privigen PA that is needed. Records faxed to [facility]. Received a receipt of confirmation.

## 2024-04-19 ENCOUNTER — Ambulatory Visit: Admitting: Neurology

## 2024-04-19 NOTE — Telephone Encounter (Signed)
 Called Pt , No Answer  Left detailed VM  to follow up with  [facility] about medication

## 2024-04-19 NOTE — Telephone Encounter (Signed)
 Mya from [facility] called wanting to let the office know that they are having trouble getting in touch with the pt for delivery of Privigen. She would like to know if a nurse can reach out to pt and inform them of this.

## 2024-04-19 NOTE — Telephone Encounter (Signed)
 Phone room, please call patient and ask her to contact [facility] about her Privigen. Certainly if there is a question for us  let us  know. Looks like they just need someone from our office to let her know they are trying to reach her.   It looks like back in October they had trouble reaching her as well.

## 2024-04-20 ENCOUNTER — Telehealth: Payer: Self-pay | Admitting: Neurology

## 2024-04-20 DIAGNOSIS — Z0289 Encounter for other administrative examinations: Secondary | ICD-10-CM

## 2024-04-20 NOTE — Telephone Encounter (Signed)
 Patient reschedule appointment,.

## 2024-04-20 NOTE — Telephone Encounter (Signed)
 Patient called to reschedule appointment due to had been in a accident.. Transferred patient to Billing to pay no show fee.

## 2024-04-21 NOTE — Telephone Encounter (Signed)
 Called  pT  No answer , called named on DPR , no answer LVM to call offcie

## 2024-05-16 ENCOUNTER — Telehealth: Payer: Self-pay | Admitting: Neurology

## 2024-05-16 ENCOUNTER — Telehealth: Payer: Self-pay | Admitting: *Deleted

## 2024-05-16 NOTE — Telephone Encounter (Signed)
 error

## 2024-05-16 NOTE — Telephone Encounter (Signed)
 Pt is asking for a letter with her diagnosis , pt asking that in the letter it states what can happen if she is not treated. Pt asking this be processed urgently for court 9:00a.m. on Tuesday

## 2024-05-16 NOTE — Telephone Encounter (Signed)
 Why is she asking for this letter? Was she summoned for jury duty?  If she was summoned for jury duty I will need the dates of summons and they juror number

## 2024-05-17 NOTE — Telephone Encounter (Signed)
 Based on last visit from May 2025, also previous history, her symptoms are under reasonable control with current IVIG  Please guide her to have MyChart access to provide her medical information, we generally do not write separate letter to state limitations.

## 2024-05-17 NOTE — Telephone Encounter (Signed)
 Phone rep called pt left vm (after checking DPR) vm stated response from Dr Onita that CMA wanted relayed to pt.

## 2024-05-25 ENCOUNTER — Telehealth: Payer: Self-pay | Admitting: *Deleted

## 2024-05-25 NOTE — Telephone Encounter (Addendum)
 Received a post-infusion update from [facility]. Pt received Privigen 70  grams at a max rate of 200 mL/hr over 4 hours on 05/05/24.  Side effects: none noted Infusion IV issues: none noted Tolerability: Tolerated well   The print quality of the second and third pages (assessment, vitals) was very poor. I called [facility] pharmacy [phone] and asked for them to fax the report again.

## 2024-06-08 NOTE — Telephone Encounter (Signed)
 Received post infusion update [facility] 05-24-2024 Privigen 70 grams rate 200ml/hr over 4 hrs. Tolerated well. 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 to MRto scan.

## 2024-08-25 ENCOUNTER — Ambulatory Visit: Admitting: Neurology
